# Patient Record
Sex: Male | Born: 1940 | Race: White | Hispanic: No | Marital: Married | Smoking: Never smoker
Health system: Southern US, Community
[De-identification: ages and names within clinical notes are randomized; demographics above are authoritative.]

## PROBLEM LIST (undated history)

## (undated) DIAGNOSIS — C801 Malignant (primary) neoplasm, unspecified: Secondary | ICD-10-CM

## (undated) DIAGNOSIS — N400 Enlarged prostate without lower urinary tract symptoms: Secondary | ICD-10-CM

## (undated) DIAGNOSIS — I4891 Unspecified atrial fibrillation: Secondary | ICD-10-CM

## (undated) DIAGNOSIS — H409 Unspecified glaucoma: Secondary | ICD-10-CM

## (undated) DIAGNOSIS — K219 Gastro-esophageal reflux disease without esophagitis: Secondary | ICD-10-CM

## (undated) DIAGNOSIS — M1990 Unspecified osteoarthritis, unspecified site: Secondary | ICD-10-CM

## (undated) DIAGNOSIS — N529 Male erectile dysfunction, unspecified: Secondary | ICD-10-CM

## (undated) DIAGNOSIS — N200 Calculus of kidney: Secondary | ICD-10-CM

## (undated) DIAGNOSIS — Z8719 Personal history of other diseases of the digestive system: Secondary | ICD-10-CM

## (undated) DIAGNOSIS — M545 Low back pain, unspecified: Secondary | ICD-10-CM

## (undated) DIAGNOSIS — J40 Bronchitis, not specified as acute or chronic: Secondary | ICD-10-CM

## (undated) DIAGNOSIS — R1310 Dysphagia, unspecified: Secondary | ICD-10-CM

## (undated) DIAGNOSIS — I861 Scrotal varices: Secondary | ICD-10-CM

## (undated) DIAGNOSIS — G4730 Sleep apnea, unspecified: Secondary | ICD-10-CM

## (undated) DIAGNOSIS — Z8619 Personal history of other infectious and parasitic diseases: Secondary | ICD-10-CM

## (undated) DIAGNOSIS — E785 Hyperlipidemia, unspecified: Secondary | ICD-10-CM

## (undated) DIAGNOSIS — I480 Paroxysmal atrial fibrillation: Secondary | ICD-10-CM

## (undated) DIAGNOSIS — Z87442 Personal history of urinary calculi: Secondary | ICD-10-CM

## (undated) DIAGNOSIS — I499 Cardiac arrhythmia, unspecified: Secondary | ICD-10-CM

## (undated) DIAGNOSIS — E669 Obesity, unspecified: Secondary | ICD-10-CM

## (undated) DIAGNOSIS — Z860100 Personal history of colon polyps, unspecified: Secondary | ICD-10-CM

## (undated) DIAGNOSIS — K259 Gastric ulcer, unspecified as acute or chronic, without hemorrhage or perforation: Secondary | ICD-10-CM

## (undated) DIAGNOSIS — Z9109 Other allergy status, other than to drugs and biological substances: Secondary | ICD-10-CM

## (undated) DIAGNOSIS — I10 Essential (primary) hypertension: Secondary | ICD-10-CM

## (undated) DIAGNOSIS — Z8711 Personal history of peptic ulcer disease: Secondary | ICD-10-CM

## (undated) DIAGNOSIS — E119 Type 2 diabetes mellitus without complications: Secondary | ICD-10-CM

## (undated) HISTORY — PX: NOSE SURGERY: SHX723

## (undated) HISTORY — PX: COLONOSCOPY: SHX174

## (undated) HISTORY — PX: HERNIA REPAIR: SHX51

## (undated) HISTORY — PX: CHOLECYSTECTOMY: SHX55

## (undated) HISTORY — PX: ESOPHAGOGASTRODUODENOSCOPY: SHX1529

## (undated) HISTORY — PX: OTHER SURGICAL HISTORY: SHX169

---
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
## 2006-07-25 ENCOUNTER — Ambulatory Visit: Payer: Self-pay | Admitting: Orthopaedic Surgery

## 2007-09-19 ENCOUNTER — Ambulatory Visit: Payer: Self-pay | Admitting: Unknown Physician Specialty

## 2008-04-25 ENCOUNTER — Ambulatory Visit: Payer: Self-pay | Admitting: Internal Medicine

## 2008-05-07 ENCOUNTER — Ambulatory Visit: Payer: Self-pay | Admitting: Internal Medicine

## 2008-06-25 ENCOUNTER — Ambulatory Visit: Payer: Self-pay | Admitting: Unknown Physician Specialty

## 2009-01-27 ENCOUNTER — Ambulatory Visit: Payer: Self-pay | Admitting: Unknown Physician Specialty

## 2011-10-06 DIAGNOSIS — C44320 Squamous cell carcinoma of skin of unspecified parts of face: Secondary | ICD-10-CM | POA: Insufficient documentation

## 2011-11-24 ENCOUNTER — Ambulatory Visit: Payer: Self-pay | Admitting: Internal Medicine

## 2012-07-09 DIAGNOSIS — N401 Enlarged prostate with lower urinary tract symptoms: Secondary | ICD-10-CM | POA: Insufficient documentation

## 2012-07-09 DIAGNOSIS — I861 Scrotal varices: Secondary | ICD-10-CM | POA: Insufficient documentation

## 2012-07-09 DIAGNOSIS — R350 Frequency of micturition: Secondary | ICD-10-CM | POA: Insufficient documentation

## 2012-07-18 DIAGNOSIS — N529 Male erectile dysfunction, unspecified: Secondary | ICD-10-CM | POA: Insufficient documentation

## 2012-10-23 ENCOUNTER — Ambulatory Visit: Payer: Self-pay | Admitting: Internal Medicine

## 2012-11-19 ENCOUNTER — Ambulatory Visit: Payer: Self-pay | Admitting: Unknown Physician Specialty

## 2012-11-21 LAB — PATHOLOGY REPORT

## 2012-11-23 ENCOUNTER — Observation Stay: Payer: Self-pay | Admitting: Internal Medicine

## 2012-11-23 ENCOUNTER — Other Ambulatory Visit: Payer: Self-pay | Admitting: Unknown Physician Specialty

## 2012-11-23 LAB — MAGNESIUM: Magnesium: 1.8 mg/dL

## 2012-11-23 LAB — BASIC METABOLIC PANEL
Calcium, Total: 9.3 mg/dL (ref 8.5–10.1)
Chloride: 107 mmol/L (ref 98–107)
Creatinine: 0.86 mg/dL (ref 0.60–1.30)
EGFR (African American): >60
Sodium: 138 mmol/L (ref 136–145)

## 2012-11-23 LAB — CLOSTRIDIUM DIFFICILE BY PCR

## 2012-11-24 LAB — CBC WITH DIFFERENTIAL/PLATELET
Basophil #: 0 10*3/uL (ref 0.0–0.1)
Basophil %: 0.1 %
Eosinophil #: 0.1 10*3/uL (ref 0.0–0.7)
Eosinophil %: 0.8 %
HCT: 41.5 % (ref 40.0–52.0)
HGB: 14.3 g/dL (ref 13.0–18.0)
Lymphocyte #: 0.8 10*3/uL — ABNORMAL LOW (ref 1.0–3.6)
Lymphocyte %: 10.2 %
MCH: 30.3 pg (ref 26.0–34.0)
MCHC: 34.5 g/dL (ref 32.0–36.0)
MCV: 88 fL (ref 80–100)
Monocyte #: 0.6 x10 3/mm (ref 0.2–1.0)
Monocyte %: 8 %
Neutrophil #: 6.6 10*3/uL — ABNORMAL HIGH (ref 1.4–6.5)
Neutrophil %: 80.9 %
Platelet: 204 10*3/uL (ref 150–440)
RBC: 4.73 10*6/uL (ref 4.40–5.90)
RDW: 14.1 % (ref 11.5–14.5)
WBC: 8.1 10*3/uL (ref 3.8–10.6)

## 2012-11-24 LAB — BASIC METABOLIC PANEL
Chloride: 106 mmol/L (ref 98–107)
Co2: 26 mmol/L (ref 21–32)
Creatinine: 0.88 mg/dL (ref 0.60–1.30)
EGFR (African American): >60
Glucose: 98 mg/dL (ref 65–99)
Osmolality: 278 (ref 275–301)
Potassium: 3.6 mmol/L (ref 3.5–5.1)
Sodium: 139 mmol/L (ref 136–145)

## 2012-11-25 LAB — COMPREHENSIVE METABOLIC PANEL
Albumin: 3.3 g/dL — ABNORMAL LOW (ref 3.4–5.0)
Alkaline Phosphatase: 56 U/L (ref 50–136)
Anion Gap: 8 (ref 7–16)
Bilirubin,Total: 0.6 mg/dL (ref 0.2–1.0)
Chloride: 106 mmol/L (ref 98–107)
Creatinine: 0.87 mg/dL (ref 0.60–1.30)
EGFR (Non-African Amer.): >60
Glucose: 100 mg/dL — ABNORMAL HIGH (ref 65–99)
Osmolality: 277 (ref 275–301)
Potassium: 3.5 mmol/L (ref 3.5–5.1)
Sodium: 139 mmol/L (ref 136–145)
Total Protein: 6.7 g/dL (ref 6.4–8.2)

## 2012-11-25 LAB — CBC WITH DIFFERENTIAL/PLATELET
Basophil #: 0 10*3/uL (ref 0.0–0.1)
Eosinophil #: 0.4 10*3/uL (ref 0.0–0.7)
Eosinophil %: 4.8 %
HCT: 41.4 % (ref 40.0–52.0)
HGB: 14.2 g/dL (ref 13.0–18.0)
Lymphocyte #: 1.3 10*3/uL (ref 1.0–3.6)
Lymphocyte %: 16.6 %
MCH: 29.9 pg (ref 26.0–34.0)
MCHC: 34.3 g/dL (ref 32.0–36.0)
MCV: 87 fL (ref 80–100)
Neutrophil %: 68.7 %
Platelet: 206 10*3/uL (ref 150–440)
RDW: 13.8 % (ref 11.5–14.5)
WBC: 7.7 10*3/uL (ref 3.8–10.6)

## 2012-11-25 LAB — STOOL CULTURE

## 2014-06-30 DIAGNOSIS — E119 Type 2 diabetes mellitus without complications: Secondary | ICD-10-CM | POA: Insufficient documentation

## 2014-06-30 DIAGNOSIS — Z8719 Personal history of other diseases of the digestive system: Secondary | ICD-10-CM | POA: Insufficient documentation

## 2014-06-30 DIAGNOSIS — Z9109 Other allergy status, other than to drugs and biological substances: Secondary | ICD-10-CM | POA: Insufficient documentation

## 2014-06-30 DIAGNOSIS — K219 Gastro-esophageal reflux disease without esophagitis: Secondary | ICD-10-CM | POA: Insufficient documentation

## 2014-06-30 DIAGNOSIS — H409 Unspecified glaucoma: Secondary | ICD-10-CM | POA: Insufficient documentation

## 2014-06-30 DIAGNOSIS — Z86010 Personal history of colonic polyps: Secondary | ICD-10-CM | POA: Insufficient documentation

## 2014-06-30 DIAGNOSIS — E785 Hyperlipidemia, unspecified: Secondary | ICD-10-CM | POA: Insufficient documentation

## 2014-06-30 DIAGNOSIS — M1990 Unspecified osteoarthritis, unspecified site: Secondary | ICD-10-CM | POA: Insufficient documentation

## 2014-06-30 DIAGNOSIS — Z8711 Personal history of peptic ulcer disease: Secondary | ICD-10-CM | POA: Insufficient documentation

## 2014-06-30 DIAGNOSIS — E669 Obesity, unspecified: Secondary | ICD-10-CM | POA: Insufficient documentation

## 2014-06-30 DIAGNOSIS — G4730 Sleep apnea, unspecified: Secondary | ICD-10-CM | POA: Insufficient documentation

## 2015-08-10 DIAGNOSIS — E119 Type 2 diabetes mellitus without complications: Secondary | ICD-10-CM | POA: Diagnosis not present

## 2015-08-17 DIAGNOSIS — G4733 Obstructive sleep apnea (adult) (pediatric): Secondary | ICD-10-CM | POA: Diagnosis not present

## 2015-08-17 DIAGNOSIS — E782 Mixed hyperlipidemia: Secondary | ICD-10-CM | POA: Diagnosis not present

## 2015-08-17 DIAGNOSIS — Z79899 Other long term (current) drug therapy: Secondary | ICD-10-CM | POA: Diagnosis not present

## 2015-08-17 DIAGNOSIS — E119 Type 2 diabetes mellitus without complications: Secondary | ICD-10-CM | POA: Diagnosis not present

## 2015-08-17 DIAGNOSIS — K219 Gastro-esophageal reflux disease without esophagitis: Secondary | ICD-10-CM | POA: Diagnosis not present

## 2015-11-12 DIAGNOSIS — X32XXXA Exposure to sunlight, initial encounter: Secondary | ICD-10-CM | POA: Diagnosis not present

## 2015-11-12 DIAGNOSIS — Z85828 Personal history of other malignant neoplasm of skin: Secondary | ICD-10-CM | POA: Diagnosis not present

## 2015-11-12 DIAGNOSIS — L821 Other seborrheic keratosis: Secondary | ICD-10-CM | POA: Diagnosis not present

## 2015-11-12 DIAGNOSIS — B353 Tinea pedis: Secondary | ICD-10-CM | POA: Diagnosis not present

## 2015-11-12 DIAGNOSIS — M71341 Other bursal cyst, right hand: Secondary | ICD-10-CM | POA: Diagnosis not present

## 2015-11-12 DIAGNOSIS — L570 Actinic keratosis: Secondary | ICD-10-CM | POA: Diagnosis not present

## 2015-11-13 DIAGNOSIS — E782 Mixed hyperlipidemia: Secondary | ICD-10-CM | POA: Diagnosis not present

## 2015-11-13 DIAGNOSIS — E119 Type 2 diabetes mellitus without complications: Secondary | ICD-10-CM | POA: Diagnosis not present

## 2015-11-13 DIAGNOSIS — Z79899 Other long term (current) drug therapy: Secondary | ICD-10-CM | POA: Diagnosis not present

## 2015-11-20 DIAGNOSIS — I499 Cardiac arrhythmia, unspecified: Secondary | ICD-10-CM | POA: Diagnosis not present

## 2015-11-20 DIAGNOSIS — E119 Type 2 diabetes mellitus without complications: Secondary | ICD-10-CM | POA: Diagnosis not present

## 2015-11-20 DIAGNOSIS — Z79899 Other long term (current) drug therapy: Secondary | ICD-10-CM | POA: Diagnosis not present

## 2015-11-20 DIAGNOSIS — E782 Mixed hyperlipidemia: Secondary | ICD-10-CM | POA: Diagnosis not present

## 2015-11-20 DIAGNOSIS — E6609 Other obesity due to excess calories: Secondary | ICD-10-CM | POA: Diagnosis not present

## 2015-11-20 DIAGNOSIS — Z0000 Encounter for general adult medical examination without abnormal findings: Secondary | ICD-10-CM | POA: Diagnosis not present

## 2015-11-20 DIAGNOSIS — G4733 Obstructive sleep apnea (adult) (pediatric): Secondary | ICD-10-CM | POA: Diagnosis not present

## 2015-11-25 DIAGNOSIS — I4891 Unspecified atrial fibrillation: Secondary | ICD-10-CM | POA: Diagnosis not present

## 2015-11-25 DIAGNOSIS — R0602 Shortness of breath: Secondary | ICD-10-CM | POA: Diagnosis not present

## 2015-11-25 DIAGNOSIS — R0609 Other forms of dyspnea: Secondary | ICD-10-CM | POA: Diagnosis not present

## 2015-11-25 DIAGNOSIS — R001 Bradycardia, unspecified: Secondary | ICD-10-CM | POA: Diagnosis not present

## 2015-11-25 DIAGNOSIS — K219 Gastro-esophageal reflux disease without esophagitis: Secondary | ICD-10-CM | POA: Diagnosis not present

## 2015-11-25 DIAGNOSIS — E669 Obesity, unspecified: Secondary | ICD-10-CM | POA: Diagnosis not present

## 2015-11-25 DIAGNOSIS — E119 Type 2 diabetes mellitus without complications: Secondary | ICD-10-CM | POA: Diagnosis not present

## 2015-11-25 DIAGNOSIS — M1990 Unspecified osteoarthritis, unspecified site: Secondary | ICD-10-CM | POA: Diagnosis not present

## 2015-11-25 DIAGNOSIS — E785 Hyperlipidemia, unspecified: Secondary | ICD-10-CM | POA: Diagnosis not present

## 2015-11-27 DIAGNOSIS — H40003 Preglaucoma, unspecified, bilateral: Secondary | ICD-10-CM | POA: Diagnosis not present

## 2015-12-11 DIAGNOSIS — R0602 Shortness of breath: Secondary | ICD-10-CM | POA: Diagnosis not present

## 2015-12-11 DIAGNOSIS — R0609 Other forms of dyspnea: Secondary | ICD-10-CM | POA: Diagnosis not present

## 2015-12-11 DIAGNOSIS — I4891 Unspecified atrial fibrillation: Secondary | ICD-10-CM | POA: Diagnosis not present

## 2015-12-16 DIAGNOSIS — R001 Bradycardia, unspecified: Secondary | ICD-10-CM | POA: Diagnosis not present

## 2015-12-16 DIAGNOSIS — M1990 Unspecified osteoarthritis, unspecified site: Secondary | ICD-10-CM | POA: Diagnosis not present

## 2015-12-16 DIAGNOSIS — R0602 Shortness of breath: Secondary | ICD-10-CM | POA: Diagnosis not present

## 2015-12-16 DIAGNOSIS — R0609 Other forms of dyspnea: Secondary | ICD-10-CM | POA: Diagnosis not present

## 2015-12-16 DIAGNOSIS — E785 Hyperlipidemia, unspecified: Secondary | ICD-10-CM | POA: Diagnosis not present

## 2015-12-16 DIAGNOSIS — K219 Gastro-esophageal reflux disease without esophagitis: Secondary | ICD-10-CM | POA: Diagnosis not present

## 2015-12-16 DIAGNOSIS — G4733 Obstructive sleep apnea (adult) (pediatric): Secondary | ICD-10-CM | POA: Diagnosis not present

## 2015-12-16 DIAGNOSIS — E669 Obesity, unspecified: Secondary | ICD-10-CM | POA: Diagnosis not present

## 2015-12-16 DIAGNOSIS — E119 Type 2 diabetes mellitus without complications: Secondary | ICD-10-CM | POA: Diagnosis not present

## 2015-12-16 DIAGNOSIS — I4891 Unspecified atrial fibrillation: Secondary | ICD-10-CM | POA: Diagnosis not present

## 2016-05-16 DIAGNOSIS — E119 Type 2 diabetes mellitus without complications: Secondary | ICD-10-CM | POA: Diagnosis not present

## 2016-05-16 DIAGNOSIS — E782 Mixed hyperlipidemia: Secondary | ICD-10-CM | POA: Diagnosis not present

## 2016-05-16 DIAGNOSIS — Z79899 Other long term (current) drug therapy: Secondary | ICD-10-CM | POA: Diagnosis not present

## 2016-05-17 DIAGNOSIS — E119 Type 2 diabetes mellitus without complications: Secondary | ICD-10-CM | POA: Diagnosis not present

## 2016-05-17 DIAGNOSIS — E782 Mixed hyperlipidemia: Secondary | ICD-10-CM | POA: Diagnosis not present

## 2016-05-17 DIAGNOSIS — Z79899 Other long term (current) drug therapy: Secondary | ICD-10-CM | POA: Diagnosis not present

## 2016-05-23 DIAGNOSIS — Z79899 Other long term (current) drug therapy: Secondary | ICD-10-CM | POA: Diagnosis not present

## 2016-05-23 DIAGNOSIS — Z6838 Body mass index (BMI) 38.0-38.9, adult: Secondary | ICD-10-CM | POA: Diagnosis not present

## 2016-05-23 DIAGNOSIS — E6609 Other obesity due to excess calories: Secondary | ICD-10-CM | POA: Diagnosis not present

## 2016-05-23 DIAGNOSIS — E785 Hyperlipidemia, unspecified: Secondary | ICD-10-CM | POA: Diagnosis not present

## 2016-05-23 DIAGNOSIS — I480 Paroxysmal atrial fibrillation: Secondary | ICD-10-CM | POA: Insufficient documentation

## 2016-05-23 DIAGNOSIS — E119 Type 2 diabetes mellitus without complications: Secondary | ICD-10-CM | POA: Diagnosis not present

## 2016-05-31 DIAGNOSIS — H40003 Preglaucoma, unspecified, bilateral: Secondary | ICD-10-CM | POA: Diagnosis not present

## 2016-06-20 DIAGNOSIS — R0602 Shortness of breath: Secondary | ICD-10-CM | POA: Diagnosis not present

## 2016-06-20 DIAGNOSIS — R001 Bradycardia, unspecified: Secondary | ICD-10-CM | POA: Diagnosis not present

## 2016-06-20 DIAGNOSIS — I4891 Unspecified atrial fibrillation: Secondary | ICD-10-CM | POA: Diagnosis not present

## 2016-06-20 DIAGNOSIS — E119 Type 2 diabetes mellitus without complications: Secondary | ICD-10-CM | POA: Diagnosis not present

## 2016-06-20 DIAGNOSIS — E669 Obesity, unspecified: Secondary | ICD-10-CM | POA: Diagnosis not present

## 2016-06-20 DIAGNOSIS — E785 Hyperlipidemia, unspecified: Secondary | ICD-10-CM | POA: Diagnosis not present

## 2016-06-20 DIAGNOSIS — G4733 Obstructive sleep apnea (adult) (pediatric): Secondary | ICD-10-CM | POA: Diagnosis not present

## 2016-06-20 DIAGNOSIS — K219 Gastro-esophageal reflux disease without esophagitis: Secondary | ICD-10-CM | POA: Diagnosis not present

## 2016-06-20 DIAGNOSIS — M1990 Unspecified osteoarthritis, unspecified site: Secondary | ICD-10-CM | POA: Diagnosis not present

## 2016-06-20 DIAGNOSIS — R0609 Other forms of dyspnea: Secondary | ICD-10-CM | POA: Diagnosis not present

## 2016-07-11 DIAGNOSIS — J40 Bronchitis, not specified as acute or chronic: Secondary | ICD-10-CM

## 2016-07-11 HISTORY — DX: Bronchitis, not specified as acute or chronic: J40

## 2016-07-18 DIAGNOSIS — R350 Frequency of micturition: Secondary | ICD-10-CM | POA: Diagnosis not present

## 2016-07-18 DIAGNOSIS — N401 Enlarged prostate with lower urinary tract symptoms: Secondary | ICD-10-CM | POA: Diagnosis not present

## 2016-07-18 DIAGNOSIS — N529 Male erectile dysfunction, unspecified: Secondary | ICD-10-CM | POA: Diagnosis not present

## 2016-08-12 ENCOUNTER — Other Ambulatory Visit: Payer: Self-pay | Admitting: Internal Medicine

## 2016-08-12 DIAGNOSIS — M7989 Other specified soft tissue disorders: Secondary | ICD-10-CM | POA: Diagnosis not present

## 2016-08-12 DIAGNOSIS — M25571 Pain in right ankle and joints of right foot: Secondary | ICD-10-CM | POA: Diagnosis not present

## 2016-08-12 DIAGNOSIS — M1990 Unspecified osteoarthritis, unspecified site: Secondary | ICD-10-CM | POA: Diagnosis not present

## 2016-08-12 DIAGNOSIS — M25572 Pain in left ankle and joints of left foot: Secondary | ICD-10-CM | POA: Diagnosis not present

## 2016-08-12 DIAGNOSIS — I480 Paroxysmal atrial fibrillation: Secondary | ICD-10-CM | POA: Diagnosis not present

## 2016-08-17 ENCOUNTER — Ambulatory Visit
Admission: RE | Admit: 2016-08-17 | Discharge: 2016-08-17 | Disposition: A | Discharge status: Home / Self Care | Payer: PPO | Source: Ambulatory Visit | Attending: Internal Medicine | Admitting: Internal Medicine

## 2016-08-17 DIAGNOSIS — M7989 Other specified soft tissue disorders: Secondary | ICD-10-CM | POA: Diagnosis not present

## 2016-08-17 DIAGNOSIS — R600 Localized edema: Secondary | ICD-10-CM | POA: Diagnosis not present

## 2016-11-11 DIAGNOSIS — D2272 Melanocytic nevi of left lower limb, including hip: Secondary | ICD-10-CM | POA: Diagnosis not present

## 2016-11-11 DIAGNOSIS — D225 Melanocytic nevi of trunk: Secondary | ICD-10-CM | POA: Diagnosis not present

## 2016-11-11 DIAGNOSIS — D2261 Melanocytic nevi of right upper limb, including shoulder: Secondary | ICD-10-CM | POA: Diagnosis not present

## 2016-11-11 DIAGNOSIS — Z85828 Personal history of other malignant neoplasm of skin: Secondary | ICD-10-CM | POA: Diagnosis not present

## 2016-11-14 DIAGNOSIS — Z79899 Other long term (current) drug therapy: Secondary | ICD-10-CM | POA: Diagnosis not present

## 2016-11-14 DIAGNOSIS — E785 Hyperlipidemia, unspecified: Secondary | ICD-10-CM | POA: Diagnosis not present

## 2016-11-14 DIAGNOSIS — E119 Type 2 diabetes mellitus without complications: Secondary | ICD-10-CM | POA: Diagnosis not present

## 2016-11-21 DIAGNOSIS — Z0000 Encounter for general adult medical examination without abnormal findings: Secondary | ICD-10-CM | POA: Diagnosis not present

## 2016-11-21 DIAGNOSIS — M1A00X0 Idiopathic chronic gout, unspecified site, without tophus (tophi): Secondary | ICD-10-CM | POA: Diagnosis not present

## 2016-11-21 DIAGNOSIS — E119 Type 2 diabetes mellitus without complications: Secondary | ICD-10-CM | POA: Diagnosis not present

## 2016-11-21 DIAGNOSIS — I480 Paroxysmal atrial fibrillation: Secondary | ICD-10-CM | POA: Diagnosis not present

## 2016-11-21 DIAGNOSIS — M7989 Other specified soft tissue disorders: Secondary | ICD-10-CM | POA: Diagnosis not present

## 2016-11-21 DIAGNOSIS — E785 Hyperlipidemia, unspecified: Secondary | ICD-10-CM | POA: Diagnosis not present

## 2016-11-21 DIAGNOSIS — K219 Gastro-esophageal reflux disease without esophagitis: Secondary | ICD-10-CM | POA: Diagnosis not present

## 2016-11-21 DIAGNOSIS — Z79899 Other long term (current) drug therapy: Secondary | ICD-10-CM | POA: Diagnosis not present

## 2016-11-25 DIAGNOSIS — H40003 Preglaucoma, unspecified, bilateral: Secondary | ICD-10-CM | POA: Diagnosis not present

## 2016-12-26 DIAGNOSIS — I4891 Unspecified atrial fibrillation: Secondary | ICD-10-CM | POA: Diagnosis not present

## 2016-12-26 DIAGNOSIS — R0609 Other forms of dyspnea: Secondary | ICD-10-CM | POA: Diagnosis not present

## 2016-12-26 DIAGNOSIS — E785 Hyperlipidemia, unspecified: Secondary | ICD-10-CM | POA: Diagnosis not present

## 2016-12-26 DIAGNOSIS — R0602 Shortness of breath: Secondary | ICD-10-CM | POA: Diagnosis not present

## 2016-12-26 DIAGNOSIS — G4733 Obstructive sleep apnea (adult) (pediatric): Secondary | ICD-10-CM | POA: Diagnosis not present

## 2016-12-26 DIAGNOSIS — K219 Gastro-esophageal reflux disease without esophagitis: Secondary | ICD-10-CM | POA: Diagnosis not present

## 2016-12-26 DIAGNOSIS — M1990 Unspecified osteoarthritis, unspecified site: Secondary | ICD-10-CM | POA: Diagnosis not present

## 2016-12-26 DIAGNOSIS — E669 Obesity, unspecified: Secondary | ICD-10-CM | POA: Diagnosis not present

## 2016-12-26 DIAGNOSIS — R001 Bradycardia, unspecified: Secondary | ICD-10-CM | POA: Diagnosis not present

## 2017-01-23 DIAGNOSIS — J40 Bronchitis, not specified as acute or chronic: Secondary | ICD-10-CM | POA: Diagnosis not present

## 2017-01-23 DIAGNOSIS — I480 Paroxysmal atrial fibrillation: Secondary | ICD-10-CM | POA: Diagnosis not present

## 2017-01-23 DIAGNOSIS — E119 Type 2 diabetes mellitus without complications: Secondary | ICD-10-CM | POA: Diagnosis not present

## 2017-02-23 DIAGNOSIS — L03115 Cellulitis of right lower limb: Secondary | ICD-10-CM | POA: Diagnosis not present

## 2017-02-23 DIAGNOSIS — M25571 Pain in right ankle and joints of right foot: Secondary | ICD-10-CM | POA: Diagnosis not present

## 2017-03-09 DIAGNOSIS — E119 Type 2 diabetes mellitus without complications: Secondary | ICD-10-CM | POA: Diagnosis not present

## 2017-03-09 DIAGNOSIS — Z23 Encounter for immunization: Secondary | ICD-10-CM | POA: Diagnosis not present

## 2017-03-09 DIAGNOSIS — S91001D Unspecified open wound, right ankle, subsequent encounter: Secondary | ICD-10-CM | POA: Diagnosis not present

## 2017-03-21 DIAGNOSIS — R509 Fever, unspecified: Secondary | ICD-10-CM | POA: Diagnosis not present

## 2017-03-22 ENCOUNTER — Encounter: Payer: PPO | Attending: Internal Medicine | Admitting: Internal Medicine

## 2017-03-22 DIAGNOSIS — E785 Hyperlipidemia, unspecified: Secondary | ICD-10-CM | POA: Insufficient documentation

## 2017-03-22 DIAGNOSIS — E1140 Type 2 diabetes mellitus with diabetic neuropathy, unspecified: Secondary | ICD-10-CM | POA: Diagnosis not present

## 2017-03-22 DIAGNOSIS — L97311 Non-pressure chronic ulcer of right ankle limited to breakdown of skin: Secondary | ICD-10-CM | POA: Diagnosis not present

## 2017-03-22 DIAGNOSIS — M109 Gout, unspecified: Secondary | ICD-10-CM | POA: Insufficient documentation

## 2017-03-22 DIAGNOSIS — Z7984 Long term (current) use of oral hypoglycemic drugs: Secondary | ICD-10-CM | POA: Insufficient documentation

## 2017-03-22 DIAGNOSIS — M1990 Unspecified osteoarthritis, unspecified site: Secondary | ICD-10-CM | POA: Diagnosis not present

## 2017-03-22 DIAGNOSIS — L97312 Non-pressure chronic ulcer of right ankle with fat layer exposed: Secondary | ICD-10-CM | POA: Diagnosis not present

## 2017-03-22 DIAGNOSIS — E11622 Type 2 diabetes mellitus with other skin ulcer: Secondary | ICD-10-CM | POA: Insufficient documentation

## 2017-03-22 DIAGNOSIS — I4891 Unspecified atrial fibrillation: Secondary | ICD-10-CM | POA: Insufficient documentation

## 2017-03-22 DIAGNOSIS — Z7901 Long term (current) use of anticoagulants: Secondary | ICD-10-CM | POA: Diagnosis not present

## 2017-03-23 NOTE — Progress Notes (Signed)
Corey, [name] ([id number]) Visit Report for 03/22/2017 Abuse/Suicide Risk Screen Details Patient Name: Corey [name], [name] Date of Service: 03/22/2017 8:00 AM Medical Record Number: [id number] Patient Account Number: [id number] Date of Birth/Sex: 19-May-1941 (76 y.o. Male) Treating RN: Montey Hora Primary Care Camdon Saetern: Fulton Reek Other Clinician: Referring Maiana Hennigan: Fulton Reek Treating Nolah Krenzer/Extender: Tito Dine in Treatment: 0 Abuse/Suicide Risk Screen Items Answer ABUSE/SUICIDE RISK SCREEN: Has anyone close to you tried to hurt or harm you recentlyo No Do you feel uncomfortable with anyone in your familyo No Has anyone forced you do things that you didnot want to doo No Do you have any thoughts of harming yourselfo No Patient displays signs or symptoms of abuse and/or neglect. No Electronic Signature(s) Signed: 03/22/2017 4:37:06 PM By: Montey Hora Entered By: Montey Hora on 03/22/2017 08:18:49 Maalaea, Matthews ([id number]) -------------------------------------------------------------------------------- Activities of Daily Living Details Patient Name: [name], [name] Date of Service: 03/22/2017 8:00 AM Medical Record Number: [id number] Patient Account Number: [id number] Date of Birth/Sex: 12/19/1940 (76 y.o. Male) Treating RN: Montey Hora Primary Care Fredonia Casalino: Fulton Reek Other Clinician: Referring Cordell Coke: Fulton Reek Treating Ardell Aaronson/Extender: Ricard Dillon Weeks in Treatment: 0 Activities of Daily Living Items Answer Activities of Daily Living (Please select one for each item) Drive Automobile Completely Able Take Medications Completely Able Use Telephone Completely Able Care for Appearance Completely Able Use Toilet Completely Able Bath / Shower Completely Able Dress Self Completely Able Feed Self Completely Able Walk Completely Able Get In / Out Bed Completely Able Housework Completely Able Prepare Meals Completely  [employer] for Self Completely Able Electronic Signature(s) Signed: 03/22/2017 4:37:06 PM By: Montey Hora Entered By: Montey Hora on 03/22/2017 08:19:08 Arvin, Clemens Catholic ([id number]) -------------------------------------------------------------------------------- Education Assessment Details Patient Name: [name], [name] Date of Service: 03/22/2017 8:00 AM Medical Record Number: [id number] Patient Account Number: [id number] Date of Birth/Sex: 12-27-1940 (76 y.o. Male) Treating RN: Montey Hora Primary Care Kandon Hosking: Fulton Reek Other Clinician: Referring Macaria Bias: Fulton Reek Treating Jihan Rudy/Extender: Tito Dine in Treatment: 0 Primary Learner Assessed: Patient Learning Preferences/Education Level/Primary Language Learning Preference: Explanation, Demonstration Highest Education Level: High School Preferred Language: English Cognitive Barrier Assessment/Beliefs Language Barrier: No Translator Needed: No Memory Deficit: No Emotional Barrier: No Cultural/Religious Beliefs Affecting Medical No Care: Physical Barrier Assessment Impaired Vision: No Impaired Hearing: No Decreased Hand dexterity: No Knowledge/Comprehension Assessment Knowledge Level: Medium Comprehension Level: Medium Ability to understand written Medium instructions: Ability to understand verbal Medium instructions: Motivation Assessment Anxiety Level: Calm Cooperation: Cooperative Education Importance: Acknowledges Need Interest in Health Problems: Asks Questions Perception: Coherent Willingness to Engage in Self- Medium Management Activities: Readiness to Engage in Self- Medium Management Activities: Electronic Signature(s) ALEXANDROS, EWAN ([id number]) Signed: 03/22/2017 4:37:06 PM By: Montey Hora Entered By: Montey Hora on 03/22/2017 08:19:32 Isaacks, Geordan Carlean Jews  ([id number]) -------------------------------------------------------------------------------- Fall Risk Assessment Details Patient Name: [name], [name] Date of Service: 03/22/2017 8:00 AM Medical Record Number: [id number] Patient Account Number: [id number] Date of Birth/Sex: Feb 17, 1941 (76 y.o. Male) Treating RN: Montey Hora Primary Care Maleeha Halls: Fulton Reek Other Clinician: Referring Aniyla Harling: Fulton Reek Treating Meeya Goldin/Extender: Tito Dine in Treatment: 0 Fall Risk Assessment Items Have you had 2 or more falls in the last 12 monthso 0 No Have you had any fall that resulted in injury in the last 12 monthso 0 No FALL RISK ASSESSMENT: History of falling - immediate or within 3 months 0 No Secondary diagnosis 0 No Ambulatory aid None/bed rest/wheelchair/nurse 0 Yes Crutches/cane/walker  0 No Furniture 0 No IV Access/Saline Lock 0 No Gait/Training Normal/bed rest/immobile 0 Yes Weak 0 No Impaired 0 No Mental Status Oriented to own ability 0 Yes Electronic Signature(s) Signed: 03/22/2017 4:37:06 PM By: Montey Hora Entered By: Montey Hora on 03/22/2017 08:19:43 Basford, Jaymason L. ([id number]) -------------------------------------------------------------------------------- Foot Assessment Details Patient Name: [name], [name] Date of Service: 03/22/2017 8:00 AM Medical Record Number: [id number] Patient Account Number: [id number] Date of Birth/Sex: 1941/03/28 (76 y.o. Male) Treating RN: Montey Hora Primary Care Joren Rehm: Fulton Reek Other Clinician: Referring Antasia Haider: Fulton Reek Treating Jahden Schara/Extender: Tito Dine in Treatment: 0 Foot Assessment Items Site Locations + = Sensation present, - = Sensation absent, C = Callus, U = Ulcer R = Redness, W = Warmth, M = Maceration, PU = Pre-ulcerative lesion F = Fissure, S = Swelling, D = Dryness Assessment Right: Left: Other Deformity: No No Prior Foot Ulcer: No No Prior  Amputation: No No Charcot Joint: No No Ambulatory Status: Ambulatory Without Help Gait: Steady Electronic Signature(s) Signed: 03/22/2017 4:37:06 PM By: Montey Hora Entered By: Montey Hora on 03/22/2017 08:32:00 Devera, Kayon L. ([id number]) -------------------------------------------------------------------------------- Nutrition Risk Assessment Details Patient Name: [name], [name] Date of Service: 03/22/2017 8:00 AM Medical Record Number: [id number] Patient Account Number: [id number] Date of Birth/Sex: [birth year] (76 y.o. Male) Treating RN: Montey Hora Primary Care Jniyah Dantuono: Fulton Reek Other Clinician: Referring Dejon Jungman: Fulton Reek Treating Westyn Driggers/Extender: Ricard Dillon Weeks in Treatment: 0 Height (in): Weight (lbs): Body Mass Index (BMI): Nutrition Risk Assessment Items NUTRITION RISK SCREEN: I have an illness or condition that made me change the kind and/or 0 No amount of food I eat I eat fewer than two meals per day 0 No I eat few fruits and vegetables, or milk products 0 No I have three or more drinks of beer, liquor or wine almost every day 0 No I have tooth or mouth problems that make it hard for me to eat 0 No I don't always have enough money to buy the food I need 0 No I eat alone most of the time 0 No I take three or more different prescribed or over-the-counter drugs a 1 Yes day Without wanting to, I have lost or gained 10 pounds in the last six 0 No months I am not always physically able to shop, cook and/or feed myself 0 No Nutrition Protocols Good Risk Protocol 0 No interventions needed Moderate Risk Protocol Electronic Signature(s) Signed: 03/22/2017 4:37:06 PM By: Montey Hora Entered By: Montey Hora on 03/22/2017 08:19:50

## 2017-03-23 NOTE — Progress Notes (Signed)
ROYCE, STEGMAN ([id number]) Visit Report for 03/22/2017 Debridement Details Patient Name: Kiser, Shayaan L. Date of Service: 03/22/2017 8:00 AM Medical Record Number: [id number] Patient Account Number: [id number] Date of Birth/Sex: [birth year] (76 y.o. Male) Treating RN: Montey Hora Primary Care Provider: Fulton Reek Other Clinician: Referring Provider: Fulton Reek Treating Provider/Extender: Tito Dine in Treatment: 0 Debridement Performed for Wound #1 Right Achilles Assessment: Performed By: Physician Ricard Dillon, MD Debridement: Debridement Severity of Tissue Pre Fat layer exposed Debridement: Pre-procedure Verification/Time Out Yes - 08:49 Taken: Start Time: 08:49 Pain Control: Lidocaine 4% Topical Solution Level: Skin/Subcutaneous Tissue Total Area Debrided (L x 0.9 (cm) x 0.8 (cm) = 0.72 (cm) W): Tissue and other Viable, Non-Viable, Eschar, Fibrin/Slough, Subcutaneous material debrided: Instrument: Curette Bleeding: Minimum Hemostasis Achieved: Pressure End Time: 08:52 Procedural Pain: 0 Post Procedural Pain: 0 Response to Treatment: Procedure was tolerated well Post Debridement Measurements of Total Wound Length: (cm) 1 Width: (cm) 0.9 Depth: (cm) 0.1 Volume: (cm) 0.071 Character of Wound/Ulcer Post Improved Debridement: Severity of Tissue Post Debridement: Fat layer exposed Post Procedure Diagnosis Same as Pre-procedure LAMORRIS, KNOBLOCK ([id number]) Electronic Signature(s) Signed: 03/22/2017 4:34:43 PM By: Linton Ham MD Signed: 03/22/2017 4:37:06 PM By: Montey Hora Entered By: Linton Ham on 03/22/2017 08:57:53 [place], [place] ([id number]) -------------------------------------------------------------------------------- HPI Details Patient Name: Vespa, Dub L. Date of Service: 03/22/2017 8:00 AM Medical Record Number: [id number] Patient Account Number: [id number] Date of Birth/Sex: June 25, 1941 (76 y.o. Male) Treating RN:  Montey Hora Primary Care Provider: Fulton Reek Other Clinician: Referring Provider: Fulton Reek Treating Provider/Extender: Tito Dine in Treatment: 0 History of Present Illness HPI Description: 03/22/17; patient is a 76 year old type II diabetic not on insulin. He has no prior wound history. Roughly 5-6 weeks ago he was out working outside had a dog on a chain which eventually wrapped around his ankle causing a fairly significant laceration over the mid part of his right Achilles. The patient tried to treat this himself with topical peroxide however he was ultimately seen in his primary physician's office on 02/23/17 felt to have a wound infection. Treated with Bactroban and 10 days of Ceftin. He was seen again in follow-up on 03/09/17 still felt to be infected and put on Septra DS twice a day for 10 days which she is completed. This hasn't resulted in marked improvement of the erythema which at one point was spreading into his anterior foot. The patient does not have a known history of PAD or claudication. No wound care history. He is a nonsmoker. 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 fib on Xarelto, type 2 diabetes on oral agents, gout including involvement of the right ankle. I note a call yesterday from his wife to his primary doctor's office in [place] with fever and chills. He was seen his primary doctor's office and given a macrolide. He feels better today. He is still applying peroxide to the wound surface. ABI in this clinic 1.16 on the right and 1.23 on the left Electronic Signature(s) Signed: 03/22/2017 4:34:43 PM By: Linton Ham MD Entered By: Linton Ham on 03/22/2017 09:05:22 [place], [place] ([id number]) -------------------------------------------------------------------------------- Physical Exam Details Patient Name: Falk, Chayton L. Date of Service: 03/22/2017 8:00 AM Medical Record Number: [id number] Patient  Account Number: [id number] Date of Birth/Sex: 06-Jul-1941 (76 y.o. Male) Treating RN: Montey Hora Primary Care Provider: Fulton Reek Other Clinician: Referring Provider: Fulton Reek Treating Provider/Extender: Ricard Dillon Weeks in Treatment: 0 Constitutional  Sitting or standing Blood Pressure is within target range for patient.. Pulse regular and within target range for patient.Marland Kitchen Respirations regular, non-labored and within target range.. Temperature is normal and within the target range for the patient.Marland Kitchen appears in no distress. Eyes Conjunctivae clear. No discharge. Respiratory Respiratory effort is easy and symmetric bilaterally. Rate is normal at rest and on room air.. Bilateral breath sounds are clear and equal in all lobes with no wheezes, rales or rhonchi.. Cardiovascular Femoral arteries without bruits and pulses strong.. Pedal pulses palpable and strong on the right. Some mild changes of chronic venous insufficiency with hemosiderin deposition but no significant edema. Lymphatic Nonpalpable no popliteal or inguinal area. Neurological Sensation in the right foot is intact to the microfilament but reduced vibration sense. Psychiatric No evidence of depression, anxiety, or agitation. Calm, cooperative, and communicative. Appropriate interactions and affect.. Notes When exam; small wound covered and the surface eschar in the mid part of his posterior Achilles. There is no surrounding erythema. The patient describes some discharge although I didn't see any. He also has a history of gout in his right ankle is fairly normal. Using a #3 curet the eschar and some nonviable surface tissue was removed. Underneath this appears to be a healthy wound that should progress towards closure. Ears no evidence of surrounding infection or gout Electronic Signature(s) Signed: 03/22/2017 4:34:43 PM By: Linton Ham MD Entered By: Linton Ham on 03/22/2017 09:04:33 [place], [place]  Carlean Jews ([id number]) -------------------------------------------------------------------------------- Physician Orders Details Patient Name: Fusilier, Shreyas L. Date of Service: 03/22/2017 8:00 AM Medical Record Number: [id number] Patient Account Number: [id number] Date of Birth/Sex: 1940/12/16 (76 y.o. 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 o May Shower, gently pat wound dry prior to applying new dressing. Anesthetic Wound #1 Right Achilles o Topical Lidocaine 4% cream applied to wound bed prior to debridement Primary Wound Dressing Wound #1 Right Achilles o Prisma Ag Secondary Dressing Wound #1 Right Achilles o Dry Gauze o Boardered Foam Dressing - or telfa island dressing Dressing Change Frequency Wound #1 Right Achilles o Change dressing every other day. Follow-up Appointments Wound #1 Right Achilles o Return Appointment in 1 week. Edema Control Wound #1 Right Achilles o Elevate legs to the level of the heart and pump ankles as often as possible Jacot, Jacek L. ([id number]) Additional Orders / Instructions Wound #1 Right Achilles o Increase protein intake. Electronic Signature(s) Signed: 03/22/2017 4:34:43 PM By: Linton Ham MD Signed: 03/22/2017 4:37:06 PM By: Montey Hora Entered By: Montey Hora on 03/22/2017 08:56:22 [place], [place] ([id number]) -------------------------------------------------------------------------------- Problem List Details Patient Name: Speegle, Justn L. Date of Service: 03/22/2017 8:00 AM Medical  Record Number: [id number] Patient Account Number: [id number] Date of Birth/Sex: 1940/09/11 (76 y.o. Male) Treating RN: Montey Hora Primary Care Provider: Fulton Reek Other Clinician: Referring Provider: Fulton Reek Treating Provider/Extender: Tito Dine in Treatment: 0 Active Problems ICD-10 Encounter Code Description Active Date Diagnosis L97.311 Non-pressure chronic ulcer of right ankle limited to 03/22/2017 Yes breakdown of skin E11.622 Type 2 diabetes mellitus with other skin ulcer 03/22/2017 Yes Inactive Problems Resolved Problems Electronic Signature(s) Signed: 03/22/2017 4:34:43 PM By: Linton Ham MD Entered  By: Linton Ham on 03/22/2017 08:43:33 [place], Thoma L. ([id number]) -------------------------------------------------------------------------------- Progress Note Details Patient Name: Eskenazi, Korver L. Date of Service: 03/22/2017 8:00 AM Medical Record Number: [id number] Patient Account Number: [id number] Date of Birth/Sex: 1940-10-21 (76 y.o. Male) Treating RN: Montey Hora Primary Care Provider: Fulton Reek Other Clinician: Referring Provider: Fulton Reek Treating Provider/Extender: Ricard Dillon Weeks in Treatment: 0 Subjective History of Present Illness (HPI) 03/22/17; patient is a 76 year old type II diabetic not on insulin. He has no prior wound history. Roughly 5-6 weeks ago he was out working outside had a dog on a chain which eventually wrapped around his ankle causing a fairly significant laceration over the mid part of his right Achilles. The patient tried to treat this himself with topical peroxide however he was ultimately seen in his primary physician's office on 02/23/17 felt to have a wound infection. Treated with Bactroban and 10 days of Ceftin. He was seen again in follow-up on 03/09/17 still felt to be infected and put on Septra DS twice a day for 10 days which she is completed. This hasn't resulted in marked  improvement of the erythema which at one point was spreading into his anterior foot. The patient does not have a known history of PAD or claudication. No wound care history. He is a nonsmoker. 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 fib on Xarelto, type 2 diabetes on oral agents, gout including involvement of the right ankle. I note a call yesterday from his wife to his primary doctor's office in [place] with fever and chills. He was seen his primary doctor's office and given a macrolide. He feels better today. He is still applying peroxide to the wound surface. ABI in this clinic 1.16 on the right and 1.23 on the left Wound History Patient presents with 1 open wound that has been present for approximately 1 month. Patient has been treating wound in the following manner: mupirocin. Laboratory tests have not been performed in the last month. Patient reportedly has not tested positive for an antibiotic resistant organism. Patient reportedly has not tested positive for osteomyelitis. Patient reportedly has not had testing performed to evaluate circulation in the legs. Patient experiences the following problems associated with their wounds: infection. Patient History Information obtained from Patient. Allergies No Known Allergies Family History Cancer - Mother, Father, No family history of Diabetes, Heart Disease, Hereditary Spherocytosis, Hypertension, Kidney Disease, Lung Disease, Seizures, Stroke, Thyroid Problems, Tuberculosis. Social History CONCEPCION, KIRKPATRICK ([id number]) Never smoker, Marital Status - Married, Alcohol Use - Rarely, Drug Use - No History, Caffeine Use - Daily. Medical History Respiratory Patient has history of Sleep Apnea Cardiovascular Patient has history of Arrhythmia - a fib Endocrine Patient has history of Type II Diabetes Musculoskeletal Patient has history of Osteoarthritis Neurologic Patient has history of Neuropathy Patient is  treated with Oral Agents. Blood sugar is tested. Medical And Surgical History Notes Respiratory acute bronchitis Cardiovascular hyperlipidemia Review of Systems (ROS) Constitutional Symptoms (General Health) The patient has no complaints or symptoms. Eyes The patient has no complaints or symptoms. Ear/Nose/Mouth/Throat The patient has no complaints or symptoms. Hematologic/Lymphatic The patient has no complaints or symptoms. Respiratory Complains or has symptoms of Shortness of Breath. Cardiovascular The patient has no complaints or symptoms. Gastrointestinal The patient has no complaints or symptoms. Genitourinary The patient has no complaints or symptoms. Immunological The patient has no complaints or symptoms. Integumentary (Skin) The patient has no complaints or symptoms. Musculoskeletal The  patient has no complaints or symptoms. Neurologic The patient has no complaints or symptoms. Oncologic The patient has no complaints or symptoms. Psychiatric The patient has no complaints or symptoms. Schermer, Marjorie L. ([id number]) Objective Constitutional Sitting or standing Blood Pressure is within target range for patient.. Pulse regular and within target range for patient.Marland Kitchen Respirations regular, non-labored and within target range.. Temperature is normal and within the target range for the patient.Marland Kitchen appears in no distress. Vitals Time Taken: 8:20 AM, Height: 66 in, Source: Measured, Weight: 252 lbs, Source: Measured, BMI: 40.7, Temperature: 98.4 F, Pulse: 92 bpm, Respiratory Rate: 18 breaths/min, Blood Pressure: 100/55 mmHg. Eyes Conjunctivae clear. No discharge. Respiratory Respiratory effort is easy and symmetric bilaterally. Rate is normal at rest and on room air.. Bilateral breath sounds are clear and equal in all lobes with no wheezes, rales or rhonchi.. Cardiovascular Femoral arteries without bruits and pulses strong.. Pedal pulses palpable and strong on the right.  Some mild changes of chronic venous insufficiency with hemosiderin deposition but no significant edema. Lymphatic Nonpalpable no popliteal or inguinal area. Neurological Sensation in the right foot is intact to the microfilament but reduced vibration sense. Psychiatric No evidence of depression, anxiety, or agitation. Calm, cooperative, and communicative. Appropriate interactions and affect.. General Notes: When exam; small wound covered and the surface eschar in the mid part of his posterior Achilles. There is no surrounding erythema. The patient describes some discharge although I didn't see any. He also has a history of gout in his right ankle is fairly normal. Using a #3 curet the eschar and some nonviable surface tissue was removed. Underneath this appears to be a healthy wound that should progress towards closure. Ears no evidence of surrounding infection or gout Integumentary (Hair, Skin) Hamler, Keric L. ([id number]) Wound #1 status is Open. Original cause of wound was Trauma. The wound is located on the Right Achilles. The wound measures 0.9cm length x 0.8cm width x 0.1cm depth; 0.565cm^2 area and 0.057cm^3 volume. There is no tunneling or undermining noted. There is a medium amount of serous drainage noted. The wound margin is flat and intact. There is no granulation within the wound bed. There is a large (67- 100%) amount of necrotic tissue within the wound bed including Eschar. The periwound skin appearance exhibited: Scarring, Erythema. The periwound skin appearance did not exhibit: Callus, Crepitus, Excoriation, Induration, Rash, Dry/Scaly, Maceration, Atrophie Blanche, Cyanosis, Ecchymosis, Hemosiderin Staining, Mottled, Pallor, Rubor. The surrounding wound skin color is noted with erythema which is circumferential. Periwound temperature was noted as No Abnormality. The periwound has tenderness on palpation. Assessment Active Problems ICD-10 G40.102 - Non-pressure chronic  ulcer of right ankle limited to breakdown of skin E11.622 - Type 2 diabetes mellitus with other skin ulcer Procedures Wound #1 Pre-procedure diagnosis of Wound #1 is a Diabetic Wound/Ulcer of the Lower Extremity located on the Right Achilles .Severity of Tissue Pre Debridement is: Fat layer exposed. There was a Skin/Subcutaneous Tissue Debridement ([id number]) debridement with total area of 0.72 sq cm performed by Ricard Dillon, MD. with the following instrument(s): Curette to remove Viable and Non-Viable tissue/material including Fibrin/Slough, Eschar, and Subcutaneous after achieving pain control using Lidocaine 4% Topical Solution. A time out was conducted at 08:49, prior to the start of the procedure. A Minimum amount of bleeding was controlled with Pressure. The procedure was tolerated well with a pain level of 0 throughout and a pain level of 0 following the procedure. Post Debridement Measurements: 1cm length x 0.9cm width x 0.1cm depth;  0.071cm^3 volume. Character of Wound/Ulcer Post Debridement is improved. Severity of Tissue Post Debridement is: Fat layer exposed. Post procedure Diagnosis Wound #1: Same as Pre-Procedure Plan Whitwell, Dontae L. ([id number]) Wound Cleansing: Wound #1 Right Achilles: Clean wound with Normal Saline. May Shower, gently pat wound dry prior to applying new dressing. Anesthetic: Wound #1 Right Achilles: Topical Lidocaine 4% cream applied to wound bed prior to debridement Primary Wound Dressing: Wound #1 Right Achilles: Prisma Ag Secondary Dressing: Wound #1 Right Achilles: Dry Gauze Boardered Foam Dressing - or telfa island dressing Dressing Change Frequency: Wound #1 Right Achilles: Change dressing every other day. Follow-up Appointments: Wound #1 Right Achilles: Return Appointment in 1 week. Edema Control: Wound #1 Right Achilles: Elevate legs to the level of the heart and pump ankles as often as possible Additional Orders /  Instructions: Wound #1 Right Achilles: Increase protein intake. #1 traumatic wound on the right Achilles area. This is already progressed nicely towards healing. Post debridement we use silver collagen and border foam. I expect this to be healed in a week or 2 #2 although the patient's history is quite convincing for infection, also review his primary doctor's note suggests the same there is no evidence of this currently no culture needed to be done. The patient describes purulence coming out of this however even after debridement there is no evidence of this #3 the patient has some elements of chronic venous insufficiency but this is mild no edema #4 probable diabetic neuropathy. His recent hemoglobin A1c however 6.4 Electronic Signature(s) Signed: 03/22/2017 4:34:43 PM By: Linton Ham MD Entered By: Linton Ham on 03/22/2017 09:06:36 [place], Clemens Catholic ([id number]) [place], [place]. ([id number]) -------------------------------------------------------------------------------- ROS/PFSH Details Patient Name: [name], Koah L. Date of Service: 03/22/2017 8:00 AM Medical Record Number: [id number] Patient Account Number: [id number] Date of Birth/Sex: [birth year] (76 y.o. Male) Treating RN: Montey Hora Primary Care Provider: Fulton Reek Other Clinician: Referring Provider: Fulton Reek Treating Provider/Extender: Tito Dine in Treatment: 0 Information Obtained From Patient Wound History Do you currently have one or more open woundso Yes How many open wounds do you currently haveo 1 Approximately how long have you had your woundso 1 month How have you been treating your wound(s) until nowo mupirocin Has your wound(s) ever healed and then re-openedo No Have you had any lab work done in the past montho No Have you tested positive for an antibiotic resistant organism (MRSA, VRE)o No Have you tested positive for osteomyelitis (bone infection)o No Have you had any tests for  circulation on your legso No Have you had other problems associated with your woundso Infection Respiratory Complaints and Symptoms: Positive for: Shortness of Breath Medical History: Positive for: Sleep Apnea Past Medical History Notes: acute bronchitis Constitutional Symptoms (General Health) Complaints and Symptoms: No Complaints or Symptoms Eyes Complaints and Symptoms: No Complaints or Symptoms Ear/Nose/Mouth/Throat Complaints and Symptoms: No Complaints or Symptoms Hematologic/Lymphatic Ferraz, Kerman L. ([id number]) Complaints and Symptoms: No Complaints or Symptoms Cardiovascular Complaints and Symptoms: No Complaints or Symptoms Medical History: Positive for: Arrhythmia - a fib Past Medical History Notes: hyperlipidemia Gastrointestinal Complaints and Symptoms: No Complaints or Symptoms Endocrine Medical History: Positive for: Type II Diabetes Treated with: Oral agents Blood sugar tested every day: Yes Tested : QD Genitourinary Complaints and Symptoms: No Complaints or Symptoms Immunological Complaints and Symptoms: No Complaints or Symptoms Integumentary (Skin) Complaints and Symptoms: No Complaints or Symptoms Musculoskeletal Complaints and Symptoms: No Complaints or Symptoms Medical History: Positive for: Osteoarthritis Neurologic Complaints and Symptoms:  No Complaints or Symptoms Schow, Leiby L. ([id number]) Medical History: Positive for: Neuropathy Oncologic Complaints and Symptoms: No Complaints or Symptoms Psychiatric Complaints and Symptoms: No Complaints or Symptoms Immunizations Pneumococcal Vaccine: Received Pneumococcal Vaccination: Yes Tetanus Vaccine: Last tetanus shot: 02/21/2017 Family and Social History Cancer: Yes - Mother, Father; Diabetes: No; Heart Disease: No; Hereditary Spherocytosis: No; Hypertension: No; Kidney Disease: No; Lung Disease: No; Seizures: No; Stroke: No; Thyroid Problems: No; Tuberculosis: No; Never  smoker; Marital Status - Married; Alcohol Use: Rarely; Drug Use: No History; Caffeine Use: Daily; Financial Concerns: No; Food, Clothing or Shelter Needs: No; Support System Lacking: No; Transportation Concerns: No; Advanced Directives: No; Patient does not want information on Advanced Directives Electronic Signature(s) Signed: 03/22/2017 4:34:43 PM By: Linton Ham MD Signed: 03/22/2017 4:37:06 PM By: Montey Hora Entered By: Montey Hora on 03/22/2017 08:29:29 [place], Loukas L. ([id number]) -------------------------------------------------------------------------------- SuperBill Details Patient Name: Goertzen, Abdulhadi L. Date of Service: 03/22/2017 Medical Record Number: [id number] Patient Account Number: [id number] Date of Birth/Sex: 07-27-1940 (76 y.o. Male) Treating RN: Montey Hora Primary Care Provider: Fulton Reek Other Clinician: Referring Provider: Fulton Reek Treating Provider/Extender: Ricard Dillon Weeks in Treatment: 0 Diagnosis Coding ICD-10 Codes Code Description B76.283 Non-pressure chronic ulcer of right ankle limited to breakdown of skin E11.622 Type 2 diabetes mellitus with other skin ulcer Facility Procedures CPT4 Code Description: 15176160 99213 - WOUND CARE VISIT-LEV 3 EST PT Modifier: Quantity: 1 CPT4 Code Description: [id number] 11042 - DEB SUBQ TISSUE 20 SQ CM/< ICD-10 Description Diagnosis L97.311 Non-pressure chronic ulcer of right ankle limited t Modifier: o breakdown of Quantity: 1 skin Physician Procedures CPT4 Code Description: [id number] WC PHYS LEVEL 3 o NEW PT ICD-10 Description Diagnosis L97.311 Non-pressure chronic ulcer of right ankle limited t E11.622 Type 2 diabetes mellitus with other skin ulcer Modifier: 25 o breakdown of Quantity: 1 skin CPT4 Code Description: 7035009 11042 - WC PHYS SUBQ TISS 20 SQ CM ICD-10 Description Diagnosis L97.311 Non-pressure chronic ulcer of right ankle limited t Modifier: o breakdown of Quantity: 1  skin Electronic Signature(s) Unsigned Previous Signature: 03/22/2017 4:34:43 PM Version By: Linton Ham MD Entered By: Montey Hora on 03/23/2017 07:57:35 Signature(s): Date(s):

## 2017-03-24 NOTE — Progress Notes (Signed)
JADIE, COMAS ([id number]) Visit Report for 03/22/2017 Allergy List Details Patient Name: Corey [name], [name] Date of Service: 03/22/2017 8:00 AM Medical Record Number: [id number] Patient Account Number: [id number] Date of Birth/Sex: [birth year] (76 y.o. Male) Treating RN: Montey Hora Primary Care Hyatt Capobianco: Fulton Reek Other Clinician: Referring Yamile Roedl: Fulton Reek Treating Caedan Sumler/Extender: Ricard Dillon Weeks in Treatment: 0 Allergies Active Allergies No Known Allergies Allergy Notes Electronic Signature(s) Signed: 03/22/2017 4:37:06 PM By: Montey Hora Entered By: Montey Hora on 03/22/2017 08:18:40 Youngers, Tayon L. ([id number]) -------------------------------------------------------------------------------- Arrival Information Details Patient Name: Corey [name], [name] Date of Service: 03/22/2017 8:00 AM Medical Record Number: [id number] Patient Account Number: [id number] Date of Birth/Sex: Jan 23, 1941 (76 y.o. Male) Treating RN: Montey Hora Primary Care Jarian Longoria: Fulton Reek Other Clinician: Referring Shemica Meath: Fulton Reek Treating Tikia Skilton/Extender: Tito Dine in Treatment: 0 Visit Information Patient Arrived: Ambulatory Arrival Time: 08:16 Accompanied By: self Transfer Assistance: None Patient Identification Verified: Yes Secondary Verification Process Yes Completed: Patient Has Alerts: Yes Patient Alerts: Patient on Blood Thinner xarelto Electronic Signature(s) Signed: 03/22/2017 4:37:06 PM By: Montey Hora Entered By: Montey Hora on 03/22/2017 08:18:19 [place]green, Bridger ([id number]) -------------------------------------------------------------------------------- Clinic Level of Care Assessment Details Patient Name: Corey [name], [name] Date of Service: 03/22/2017 8:00 AM Medical Record Number: [id number] Patient Account Number: [id number] Date of Birth/Sex: July 31, 1940 (76 y.o. Male) Treating RN: Montey Hora Primary Care Brandee Markin:  Fulton Reek Other Clinician: Referring Jafeth Mustin: Fulton Reek Treating Sehaj Mcenroe/Extender: Tito Dine in Treatment: 0 Clinic Level of Care Assessment Items TOOL 1 Quantity Score []  - Use when EandM and Procedure is performed on INITIAL visit 0 ASSESSMENTS - Nursing Assessment / Reassessment X - General Physical Exam (combine w/ comprehensive assessment (listed just 1 20 below) when performed on new pt. evals) X - Comprehensive Assessment (HX, ROS, Risk Assessments, Wounds Hx, etc.) 1 25 ASSESSMENTS - Wound and Skin Assessment / Reassessment []  - Dermatologic / Skin Assessment (not related to wound area) 0 ASSESSMENTS - Ostomy and/or Continence Assessment and Care []  - Incontinence Assessment and Management 0 []  - Ostomy Care Assessment and Management (repouching, etc.) 0 PROCESS - Coordination of Care X - Simple Patient / Family Education for ongoing care 1 15 []  - Complex (extensive) Patient / Family Education for ongoing care 0 X - Staff obtains Programmer, systems, Records, Test Results / Process Orders 1 10 []  - Staff telephones HHA, Nursing Homes / Clarify orders / etc 0 []  - Routine Transfer to another Facility (non-emergent condition) 0 []  - Routine Hospital Admission (non-emergent condition) 0 X - New Admissions / Biomedical engineer / Ordering NPWT, Apligraf, etc. 1 15 []  - Emergency Hospital Admission (emergent condition) 0 PROCESS - Special Needs []  - Pediatric / Minor Patient Management 0 []  - Isolation Patient Management 0 Bucholz, Travas L. ([id number]) []  - Hearing / Language / Visual special needs 0 []  - Assessment of Community assistance (transportation, D/C planning, etc.) 0 []  - Additional assistance / Altered mentation 0 []  - Support Surface(s) Assessment (bed, cushion, seat, etc.) 0 INTERVENTIONS - Miscellaneous []  - External ear exam 0 []  - Patient Transfer (multiple staff / Civil Service fast streamer / Similar devices) 0 []  - Simple Staple / Suture removal (25 or  less) 0 []  - Complex Staple / Suture removal (26 or more) 0 []  - Hypo/Hyperglycemic Management (do not check if billed separately) 0 X - Ankle / Brachial Index (ABI) - do not check if billed separately 1 15 Has the patient been seen at the hospital within the  last three years: Yes Total Score: 100 Level Of Care: New/Established - Level 3 Electronic Signature(s) Signed: 03/22/2017 4:37:06 PM By: Montey Hora Entered By: Montey Hora on 03/22/2017 11:25:11 Krock, Terez L. ([id number]) -------------------------------------------------------------------------------- Encounter Discharge Information Details Patient Name: Corey [name], [name] Date of Service: 03/22/2017 8:00 AM Medical Record Number: [id number] Patient Account Number: [id number] Date of Birth/Sex: 08/12/1940 (76 y.o. Male) Treating RN: Montey Hora Primary Care Abimael Zeiter: Fulton Reek Other Clinician: Referring Deziyah Arvin: Fulton Reek Treating Rainn Zupko/Extender: Tito Dine in Treatment: 0 Encounter Discharge Information Items Discharge Pain Level: 0 Discharge Condition: Stable Ambulatory Status: Ambulatory Discharge Destination: Home Transportation: Private Auto Accompanied By: self Schedule Follow-up Appointment: Yes Medication Reconciliation completed and provided to Patient/Care No Lonnetta Kniskern: Provided on Clinical Summary of Care: 03/22/2017 Form Type Recipient Paper Patient HP Electronic Signature(s) Signed: 03/23/2017 9:46:21 AM By: Ruthine Dose Entered By: Ruthine Dose on 03/22/2017 09:05:10 Corey [name], [name] ([id number]) -------------------------------------------------------------------------------- Lower Extremity Assessment Details Patient Name: Corey [name], [name] Date of Service: 03/22/2017 8:00 AM Medical Record Number: [id number] Patient Account Number: [id number] Date of Birth/Sex: [birth year] (76 y.o. Male) Treating RN: Montey Hora Primary Care Vonda Harth: Fulton Reek Other  Clinician: Referring Diago Haik: Fulton Reek Treating Sy Saintjean/Extender: Tito Dine in Treatment: 0 Vascular Assessment Pulses: Dorsalis Pedis Palpable: [Left:Yes] [Right:Yes] Doppler Audible: [Left:Yes] [Right:Yes] Posterior Tibial Palpable: [Left:Yes] [Right:Yes] Doppler Audible: [Left:Yes] [Right:Yes] Extremity colors, hair growth, and conditions: Extremity Color: [Left:Hyperpigmented] [Right:Hyperpigmented] Hair Growth on Extremity: [Left:Yes] [Right:Yes] Temperature of Extremity: [Left:Warm] [Right:Warm] Capillary Refill: [Left:< 3 seconds] [Right:< 3 seconds] Blood Pressure: Brachial: [Left:128] [Right:122] Dorsalis Pedis: 158 [Left:Dorsalis Pedis: 148] Ankle: Posterior Tibial: 144 [Left:Posterior Tibial: 1.23] [Right:1.16] Toe Nail Assessment Left: Right: Thick: Yes Yes Discolored: Yes Yes Deformed: No No Improper Length and Hygiene: No No Electronic Signature(s) Signed: 03/22/2017 4:37:06 PM By: Montey Hora Entered By: Montey Hora on 03/22/2017 08:43:27 Corey [name], [name] ([id number]) -------------------------------------------------------------------------------- Multi Wound Chart Details Patient Name: Corey [name], [name] Date of Service: 03/22/2017 8:00 AM Medical Record Number: [id number] Patient Account Number: [id number] Date of Birth/Sex: 1941-04-25 (76 y.o. Male) Treating RN: Montey Hora Primary Care Macrae Wiegman: Fulton Reek Other Clinician: Referring Shraddha Lebron: Fulton Reek Treating Camira Geidel/Extender: Ricard Dillon Weeks in Treatment: 0 Vital Signs Height(in): 66 Pulse(bpm): 92 Weight(lbs): 252 Blood Pressure 100/55 (mmHg): Body Mass Index(BMI): 41 Temperature(F): 98.4 Respiratory Rate 18 (breaths/min): Photos: [1:No Photos] [N/A:N/A] Wound Location: [1:Right Achilles] [N/A:N/A] Wounding Event: [1:Trauma] [N/A:N/A] Primary Etiology: [1:Diabetic Wound/Ulcer of N/A the Lower Extremity] Secondary Etiology: [1:Trauma, Other]  [N/A:N/A] Comorbid History: [1:Sleep Apnea, Arrhythmia, N/A Type II Diabetes, Osteoarthritis, Neuropathy] Date Acquired: [1:02/20/2017] [N/A:N/A] Weeks of Treatment: [1:0] [N/A:N/A] Wound Status: [1:Open] [N/A:N/A] Measurements L x W x D 0.9x0.8x0.1 [N/A:N/A] (cm) Area (cm) : [1:0.565] [N/A:N/A] Volume (cm) : [1:0.057] [N/A:N/A] % Reduction in Area: [1:-348.40%] [N/A:N/A] % Reduction in Volume: -338.50% [N/A:N/A] Classification: [1:Grade 1] [N/A:N/A] Exudate Amount: [1:Medium] [N/A:N/A] Exudate Type: [1:Serous] [N/A:N/A] Exudate Color: [1:amber] [N/A:N/A] Wound Margin: [1:Flat and Intact] [N/A:N/A] Granulation Amount: [1:None Present (0%)] [N/A:N/A] Necrotic Amount: [1:Large (67-100%)] [N/A:N/A] Necrotic Tissue: [1:Eschar] [N/A:N/A] Exposed Structures: [1:Fascia: No Fat Layer (Subcutaneous Tissue) Exposed: No Tendon: No] [N/A:N/A] Muscle: No Joint: No Bone: No Epithelialization: None N/A N/A Debridement: Debridement (15400- N/A N/A 11047) Pre-procedure 08:49 N/A N/A Verification/Time Out Taken: Pain Control: Lidocaine 4% Topical N/A N/A Solution Tissue Debrided: Necrotic/Eschar, N/A N/A Fibrin/Slough, Subcutaneous Level: Skin/Subcutaneous N/A N/A Tissue Debridement Area (sq 0.72 N/A N/A cm): Instrument: Curette N/A N/A Bleeding: Minimum N/A N/A Hemostasis Achieved: Pressure N/A N/A Procedural Pain: 0 N/A  N/A Post Procedural Pain: 0 N/A N/A Debridement Treatment Procedure was tolerated N/A N/A Response: well Post Debridement 1x0.9x0.1 N/A N/A Measurements L x W x D (cm) Post Debridement 0.071 N/A N/A Volume: (cm) Periwound Skin Texture: Scarring: Yes N/A N/A Excoriation: No Induration: No Callus: No Crepitus: No Rash: No Periwound Skin Maceration: No N/A N/A Moisture: Dry/Scaly: No Periwound Skin Color: Erythema: Yes N/A N/A Atrophie Blanche: No Cyanosis: No Ecchymosis: No Hemosiderin Staining: No Mottled: No Pallor: No Rubor: No Erythema  Location: Circumferential N/A N/A Temperature: No Abnormality N/A N/A Tenderness on Yes N/A N/A Palpation: Corey [name], [name] ([id number]) Wound Preparation: Ulcer Cleansing: N/A N/A Rinsed/Irrigated with Saline Topical Anesthetic Applied: Other: lidocaine 4% Procedures Performed: Debridement N/A N/A Treatment Notes Electronic Signature(s) Signed: 03/22/2017 4:34:43 PM By: Linton Ham MD Entered By: Linton Ham on 03/22/2017 08:57:40 Corey [name], Corey [name] ([id number]) -------------------------------------------------------------------------------- Multi-Disciplinary Care Plan Details Patient Name: Corey [name], [name] Date of Service: 03/22/2017 8:00 AM Medical Record Number: [id number] Patient Account Number: [id number] Date of Birth/Sex: 1940-12-29 (76 y.o. Male) Treating RN: Montey Hora Primary Care Benjy Kana: Fulton Reek Other Clinician: Referring Aarion Metzgar: Fulton Reek Treating Shreshta Medley/Extender: Tito Dine in Treatment: 0 Active Inactive ` Abuse / Safety / Falls / Self Care Management Nursing Diagnoses: Impaired physical mobility Goals: Patient will not experience any injury related to falls Date Initiated: 03/22/2017 Target Resolution Date: 06/16/2017 Goal Status: Active Interventions: Assess fall risk on admission and as needed Notes: ` Orientation to the Wound Care Program Nursing Diagnoses: Knowledge deficit related to the wound healing center program Goals: Patient/caregiver will verbalize understanding of the [facility] Date Initiated: 03/22/2017 Target Resolution Date: 06/16/2017 Goal Status: Active Interventions: Provide education on orientation to the wound center Notes: ` Wound/Skin Impairment Nursing Diagnoses: Impaired tissue integrity Corey [name], [name] ([id number]) Goals: Ulcer/skin breakdown will heal within 14 weeks Date Initiated: 03/22/2017 Target Resolution Date: 06/16/2017 Goal Status:  Active Interventions: Assess patient/caregiver ability to obtain necessary supplies Assess patient/caregiver ability to perform ulcer/skin care regimen upon admission and as needed Assess ulceration(s) every visit Notes: Electronic Signature(s) Signed: 03/22/2017 4:37:06 PM By: Montey Hora Entered By: Montey Hora on 03/22/2017 08:51:41 Corey [name], Corey [name] L. ([id number]) -------------------------------------------------------------------------------- Pain Assessment Details Patient Name: Yontz, Brant L. Date of Service: 03/22/2017 8:00 AM Medical Record Number: [id number] Patient Account Number: [id number] Date of Birth/Sex: 13-May-1941 (76 y.o. Male) Treating RN: Montey Hora Primary Care Dvante Hands: Fulton Reek Other Clinician: Referring Lynette Noah: Fulton Reek Treating Lyana Asbill/Extender: Tito Dine in Treatment: 0 Active Problems Location of Pain Severity and Description of Pain Patient Has Paino No Site Locations Pain Management and Medication Current Pain Management: Notes Topical or injectable lidocaine is offered to patient for acute pain when surgical debridement is performed. If needed, Patient is instructed to use over the counter pain medication for the following 24-48 hours after debridement. Wound care MDs do not prescribed pain medications. Patient has chronic pain or uncontrolled pain. Patient has been instructed to make an appointment with their Primary Care Physician for pain management. Electronic Signature(s) Signed: 03/22/2017 4:37:06 PM By: Montey Hora Entered By: Montey Hora on 03/22/2017 08:18:29 Schafer, Clemens Catholic ([id number]) -------------------------------------------------------------------------------- Patient/Caregiver Education Details Patient Name: Corey [name], Majour L. Date of Service: 03/22/2017 8:00 AM Medical Record Number: [id number] Patient Account Number: [id number] Date of Birth/Gender: 08/22/1940 (76 y.o. Male) Treating RN: Montey Hora Primary Care Physician: Fulton Reek Other Clinician: Referring Physician: Fulton Reek Treating Physician/Extender: Tito Dine in Treatment: 0 Education Assessment Education Provided To:  Patient Education Topics Provided Wound/Skin Impairment: Handouts: Other: wound care as ordered Methods: Demonstration, Explain/Verbal Responses: State content correctly Electronic Signature(s) Signed: 03/22/2017 4:37:06 PM By: Montey Hora Entered By: Montey Hora on 03/22/2017 08:49:46 Abeytas, Temelec ([id number]) -------------------------------------------------------------------------------- Wound Assessment Details Patient Name: Mungin, Jonavan L. Date of Service: 03/22/2017 8:00 AM Medical Record Number: [id number] Patient Account Number: [id number] Date of Birth/Sex: January 09, 1941 (76 y.o. Male) Treating RN: Montey Hora Primary Care Clarece Drzewiecki: Fulton Reek Other Clinician: Referring Nilay Mangrum: Fulton Reek Treating Ellarie Picking/Extender: Ricard Dillon Weeks in Treatment: 0 Wound Status Wound Number: 1 Primary Diabetic Wound/Ulcer of the Lower Etiology: Extremity Wound Location: Right Achilles Secondary Trauma, Other Wounding Event: Trauma Etiology: Date Acquired: 02/20/2017 Wound Open Weeks Of Treatment: 0 Status: Clustered Wound: No Comorbid Sleep Apnea, Arrhythmia, Type II History: Diabetes, Osteoarthritis, Neuropathy Photos Photo Uploaded By: Gretta Cool, BSN, RN, CWS, Kim on 03/22/2017 16:35:01 Wound Measurements Length: (cm) 0.9 Width: (cm) 0.8 Depth: (cm) 0.1 Area: (cm) 0.565 Volume: (cm) 0.057 % Reduction in Area: -348.4% % Reduction in Volume: -338.5% Epithelialization: None Tunneling: No Undermining: No Wound Description Classification: Grade 1 Foul Odor Afte Wound Margin: Flat and Intact Slough/Fibrino Exudate Amount: Medium Exudate Type: Serous Exudate Color: amber r Cleansing: No No Wound Bed Granulation Amount: None Present  (0%) Exposed Structure Necrotic Amount: Large (67-100%) Fascia Exposed: No Necrotic Quality: Eschar Fat Layer (Subcutaneous Tissue) Exposed: No Uliano, Cassady L. ([id number]) Tendon Exposed: No Muscle Exposed: No Joint Exposed: No Bone Exposed: No Periwound Skin Texture Texture Color No Abnormalities Noted: No No Abnormalities Noted: No Callus: No Atrophie Blanche: No Crepitus: No Cyanosis: No Excoriation: No Ecchymosis: No Induration: No Erythema: Yes Rash: No Erythema Location: Circumferential Scarring: Yes Hemosiderin Staining: No Mottled: No Moisture Pallor: No No Abnormalities Noted: No Rubor: No Dry / Scaly: No Maceration: No Temperature / Pain Temperature: No Abnormality Tenderness on Palpation: Yes Wound Preparation Ulcer Cleansing: Rinsed/Irrigated with Saline Topical Anesthetic Applied: Other: lidocaine 4%, Treatment Notes Wound #1 (Right Achilles) 1. Cleansed with: Clean wound with Normal Saline 2. Anesthetic Topical Lidocaine 4% cream to wound bed prior to debridement 4. Dressing Applied: Prisma Ag 5. Secondary [place] Signature(s) Signed: 03/22/2017 4:37:06 PM By: Montey Hora Entered By: Montey Hora on 03/22/2017 08:53:27 Dolinar, Bunyan Carlean [name] ([id number]) -------------------------------------------------------------------------------- Vitals Details Patient Name: Corey [name], Mattthew L. Date of Service: 03/22/2017 8:00 AM Medical Record Number: [id number] Patient Account Number: [id number] Date of Birth/Sex: 1940/08/19 (76 y.o. Male) Treating RN: Montey Hora Primary Care Kandra Graven: Fulton Reek Other Clinician: Referring Ngai Parcell: Fulton Reek Treating Gurney Balthazor/Extender: Tito Dine in Treatment: 0 Vital Signs Time Taken: 08:20 Temperature (F): 98.4 Height (in): 66 Pulse (bpm): 92 Source: Measured Respiratory Rate (breaths/min): 18 Weight (lbs): 252 Blood Pressure (mmHg): 100/55 Source:  Measured Reference Range: 80 - 120 mg / dl Body Mass Index (BMI): 40.7 Electronic Signature(s) Signed: 03/22/2017 4:37:06 PM By: Montey Hora Entered By: Montey Hora on 03/22/2017 08:24:09

## 2017-03-29 ENCOUNTER — Encounter: Payer: PPO | Admitting: Internal Medicine

## 2017-03-29 DIAGNOSIS — E11622 Type 2 diabetes mellitus with other skin ulcer: Secondary | ICD-10-CM | POA: Diagnosis not present

## 2017-03-29 DIAGNOSIS — S91011A Laceration without foreign body, right ankle, initial encounter: Secondary | ICD-10-CM | POA: Diagnosis not present

## 2017-03-29 DIAGNOSIS — I872 Venous insufficiency (chronic) (peripheral): Secondary | ICD-10-CM | POA: Diagnosis not present

## 2017-03-31 NOTE — Progress Notes (Signed)
Corey, [name] ([id number]) Visit Report for 03/29/2017 HPI Details Patient Name: Corey [name], [name] Date of Service: 03/29/2017 9:15 AM Medical Record Number: [id number] Patient Account Number: [id number] Date of Birth/Sex: [birth year] (76 y.o. Male) Treating RN: Montey Hora Primary Care Provider: Fulton Reek Other Clinician: Referring Provider: Fulton Reek Treating Provider/Extender: Tito Dine in Treatment: 1 History of Present Illness HPI Description: 03/22/17; patient is a 76 year old type II diabetic not on insulin. He has no prior wound history. Roughly 5-6 weeks ago he was out working outside had a dog on a chain which eventually wrapped around his ankle causing a fairly significant laceration over the mid part of his right Achilles. The patient tried to treat this himself with topical peroxide however he was ultimately seen in his primary physician's office on 02/23/17 felt to have a wound infection. Treated with Bactroban and 10 days of Ceftin. He was seen again in follow-up on 03/09/17 still felt to be infected and put on Septra DS twice a day for 10 days which she is completed. This hasn't resulted in marked improvement of the erythema which at one point was spreading into his anterior foot. The patient does not have a known history of PAD or claudication. No wound care history. He is a nonsmoker. 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 fib on Xarelto, type 2 diabetes on oral agents, gout including involvement of the right ankle. I note a call yesterday from his wife to his primary doctor's office in [place] with fever and chills. He was seen his primary doctor's office and given a macrolide. He feels better today. He is still applying peroxide to the wound surface. ABI in this clinic 1.16 on the right and 1.23 on the left. 03/29/17; patient came to the clinic last week with a laceration on the left Achilles area. He is a type  II diabetic. He states after he was here in the clinic last week he developed small red rash on his bilateral lower extremities, on-call doctor ordered doxycycline for him. I don't see any evidence of that currently Electronic Signature(s) Signed: 03/29/2017 5:33:43 PM By: Linton Ham MD Entered By: Linton Ham on 03/29/2017 10:09:55 [place], Clemens Catholic ([id number]) -------------------------------------------------------------------------------- Physical Exam Details Patient Name: Corey, [name] Date of Service: 03/29/2017 9:15 AM Medical Record Number: [id number] Patient Account Number: [id number] Date of Birth/Sex: 1941-03-14 (76 y.o. Male) Treating RN: Montey Hora Primary Care Provider: Fulton Reek Other Clinician: Referring Provider: Fulton Reek Treating Provider/Extender: Ricard Dillon Weeks in Treatment: 1 Constitutional Sitting or standing Blood Pressure is within target range for patient.. Pulse regular and within target range for patient.Marland Kitchen Respirations regular, non-labored and within target range.. Temperature is normal and within the target range for the patient.Marland Kitchen appears in no distress. Cardiovascular Pedal pulses palpable and strong bilaterally.. Minimal edema. Integumentary (Hair, Skin) Chronic venous insufficiency with hemosiderin deposition although I see no rash. Notes 9/90/18; this wound is fully epithelialized with immature skin. I think this is going to close over by next week. Electronic Signature(s) Signed: 03/29/2017 5:33:43 PM By: Linton Ham MD Entered By: Linton Ham on 03/29/2017 10:16:03 [place], Clemens Catholic ([id number]) -------------------------------------------------------------------------------- Physician Orders Details Patient Name: Corey Raya, [name] Date of Service: 03/29/2017 9:15 AM Medical Record Number: [id number] Patient Account Number: [id number] Date of Birth/Sex: 02/05/1941 (76 y.o. Male) Treating RN: Montey Hora Primary Care  Provider: Fulton Reek Other Clinician: Referring Provider: Fulton Reek Treating Provider/Extender: Ricard Dillon Weeks in  Treatment: 1 Verbal / Phone Orders: No Diagnosis Coding Wound Cleansing Wound #1 Right Achilles o Clean wound with Normal Saline. o May Shower, gently pat wound dry prior to applying new dressing. Anesthetic Wound #1 Right Achilles o Topical Lidocaine 4% cream applied to wound bed prior to debridement Secondary Dressing Wound #1 Right Achilles o Boardered Foam Dressing - or telfa island dressing o Non-adherent pad Dressing Change Frequency Wound #1 Right Achilles o Change dressing every other day. Follow-up Appointments Wound #1 Right Achilles o Return Appointment in 1 week. Edema Control Wound #1 Right Achilles o Elevate legs to the level of the heart and pump ankles as often as possible Additional Orders / Instructions Wound #1 Right Achilles o Increase protein intake. Electronic Signature(s) Signed: 03/29/2017 5:33:43 PM By: Linton Ham MD Signed: 03/30/2017 4:38:09 PM By: [place], Clemens Catholic ([id number]) Entered By: Montey Hora on 03/29/2017 09:42:52 [place], Anna ([id number]) -------------------------------------------------------------------------------- Problem List Details Patient Name: [name], [name] Date of Service: 03/29/2017 9:15 AM Medical Record Number: [id number] Patient Account Number: [id number] Date of Birth/Sex: [birth year] (76 y.o. 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 ([id number]) -------------------------------------------------------------------------------- Progress Note Details Patient Name: Kathan, Kinsler L. Date of Service: 03/29/2017 9:15 AM Medical Record Number: [id number] Patient Account Number: [id number] Date of Birth/Sex: 1940-10-04 (76 y.o. Male) Treating RN: Montey Hora Primary Care Provider: Fulton Reek Other Clinician: Referring Provider: Fulton Reek Treating Provider/Extender: Ricard Dillon Weeks in Treatment: 1 Subjective History of Present Illness (HPI) 03/22/17; patient is a 76 year old type II diabetic not on insulin. He has no prior wound history. Roughly 5-6 weeks ago he was out working outside had a dog on a chain which eventually wrapped around his ankle causing a fairly significant laceration over the mid part of his right Achilles. The patient tried to treat this himself with topical peroxide however he was ultimately seen in his primary physician's office on 02/23/17 felt to have a wound infection. Treated with Bactroban and 10 days of Ceftin. He was seen again in follow-up on 03/09/17 still felt to be infected and put on Septra DS twice a day for 10 days which she is completed. This hasn't resulted in marked improvement of the erythema which at one point was spreading into his anterior foot. The patient does not have a known history of PAD or claudication. No wound care history. He is a nonsmoker. 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 fib on Xarelto, type 2 diabetes on oral agents, gout including involvement of the right ankle. I note a call yesterday from his wife to his primary doctor's office in [place] with fever and chills. He was seen his primary doctor's office and given a macrolide. He feels better today. He is still applying  peroxide to the wound surface. ABI in this clinic 1.16 on the right and 1.23 on the left. 03/29/17; patient came to the clinic last week with a laceration on the left Achilles area. He is a type II diabetic. He states after  he was here in the clinic last week he developed small red rash on his bilateral lower extremities, on-call doctor ordered doxycycline for him. I don't see any evidence of that currently Objective Constitutional Sitting or standing Blood Pressure is within target range for patient.. Pulse regular and within target range for patient.Marland Kitchen Respirations regular, non-labored and within target range.. Temperature is normal and within the target range for the patient.Marland Kitchen appears in no distress. Vitals Time Taken: 9:14 AM, Height: 66 in, Weight: 252 lbs, BMI: 40.7, Temperature: 98.7 F, Pulse: 76 bpm, Respiratory Rate: 18 breaths/min, Blood Pressure: 121/56 mmHg. Filosa, Xavior L. ([id number]) Cardiovascular Pedal pulses palpable and strong bilaterally.. Minimal edema. General Notes: 9/90/18; this wound is fully epithelialized with immature skin. I think this is going to close over by next week. Integumentary (Hair, Skin) Chronic venous insufficiency with hemosiderin deposition although I see no rash. Wound #1 status is Open. Original cause of wound was Trauma. The wound is located on the Right Achilles. The wound measures 0.2cm length x 0.3cm width x 0.1cm depth; 0.047cm^2 area and 0.005cm^3 volume. There is no tunneling or undermining noted. There is a medium amount of serous drainage noted. The wound margin is flat and intact. There is large (67-100%) pink granulation within the wound bed. There is no necrotic tissue within the wound bed. The periwound skin appearance exhibited: Scarring, Erythema. The periwound skin appearance did not exhibit: Callus, Crepitus, Excoriation, Induration, Rash, Dry/Scaly, Maceration, Atrophie Blanche, Cyanosis, Ecchymosis, Hemosiderin Staining, Mottled,  Pallor, Rubor. The surrounding wound skin color is noted with erythema which is circumferential. Periwound temperature was noted as No Abnormality. The periwound has tenderness on palpation. 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 May Shower, gently pat wound dry prior to applying new dressing. Anesthetic: Wound #1 Right Achilles: Topical Lidocaine 4% cream applied to wound bed prior to debridement Secondary Dressing: Wound #1 Right Achilles: Boardered Foam Dressing - or telfa island dressing Bryngelson, Olusegun L. ([id number]) Non-adherent pad Dressing Change Frequency: Wound #1 Right Achilles: Change dressing every other day. Follow-up Appointments: Wound #1 Right Achilles: Return Appointment in 1 week. Edema Control: Wound #1 Right Achilles: Elevate legs to the level of the heart and pump ankles as often as possible Additional Orders / Instructions: Wound #1 Right Achilles: Increase protein intake. #1 report border foam on this and I'll see this back next week but I think this should be healed at that point Electronic Signature(s) Signed: 03/29/2017 5:33:43 PM By: Linton Ham MD Entered By: Linton Ham on 03/29/2017 10:16:37 [place], [place] ([id number]) -------------------------------------------------------------------------------- SuperBill Details Patient Name: Gohr, Otho L. Date of Service: 03/29/2017 Medical Record Number: [id number] Patient Account Number: [id number] Date of Birth/Sex: [birth year] (76 y.o. 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

## 2017-03-31 NOTE — Progress Notes (Signed)
BOBAK, OGUINN ([id number]) Visit Report for 03/29/2017 Arrival Information Details Patient Name: Corey [name], Corey [name]. Date of Service: 03/29/2017 9:15 AM Medical Record Number: [id number] Patient Account Number: [id number] Date of Birth/Sex: 11-19-1940 (76 y.o. Male) Treating RN: Montey Hora Primary Care Natina Wiginton: Fulton Reek Other Clinician: Referring Evalyn Shultis: Fulton Reek Treating Amala Petion/Extender: Tito Dine in Treatment: 1 Visit Information History Since Last Visit Added or deleted any medications: No Patient Arrived: Ambulatory Any new allergies or adverse reactions: No Arrival Time: 09:13 Had a fall or experienced change in No Accompanied By: self activities of daily living that may affect Transfer Assistance: None risk of falls: Patient Identification Verified: Yes Signs or symptoms of abuse/neglect since last No Secondary Verification Process Yes visito Completed: Hospitalized since last visit: No Patient Has Alerts: Yes Has Dressing in Place as Prescribed: Yes Patient Alerts: Patient on Blood Pain Present Now: No Thinner xarelto Electronic Signature(s) Signed: 03/30/2017 4:38:09 PM By: Montey Hora Entered By: Montey Hora on 03/29/2017 09:13:45 Ducharme, Demari L. ([id number]) -------------------------------------------------------------------------------- Clinic Level of Care Assessment Details Patient Name: Ballin, Axl L. Date of Service: 03/29/2017 9:15 AM Medical Record Number: [id number] Patient Account Number: [id number] Date of Birth/Sex: 10-Jan-1941 (76 y.o. Male) Treating RN: Montey Hora Primary Care Squire Withey: Fulton Reek Other Clinician: Referring Khaleesi Gruel: Fulton Reek Treating Mirage Pfefferkorn/Extender: Tito Dine in Treatment: 1 Clinic Level of Care Assessment Items TOOL 4 Quantity Score []  - Use when only an EandM is performed on FOLLOW-UP visit 0 ASSESSMENTS - Nursing Assessment / Reassessment X - Reassessment of  Co-morbidities (includes updates in patient status) 1 10 X - Reassessment of Adherence to Treatment Plan 1 5 ASSESSMENTS - Wound and Skin Assessment / Reassessment X - Simple Wound Assessment / Reassessment - one wound 1 5 []  - Complex Wound Assessment / Reassessment - multiple wounds 0 []  - Dermatologic / Skin Assessment (not related to wound area) 0 ASSESSMENTS - Focused Assessment []  - Circumferential Edema Measurements - multi extremities 0 []  - Nutritional Assessment / Counseling / Intervention 0 X - Lower Extremity Assessment (monofilament, tuning fork, pulses) 1 5 []  - Peripheral Arterial Disease Assessment (using hand held doppler) 0 ASSESSMENTS - Ostomy and/or Continence Assessment and Care []  - Incontinence Assessment and Management 0 []  - Ostomy Care Assessment and Management (repouching, etc.) 0 PROCESS - Coordination of Care X - Simple Patient / Family Education for ongoing care 1 15 []  - Complex (extensive) Patient / Family Education for ongoing care 0 []  - Staff obtains Programmer, systems, Records, Test Results / Process Orders 0 []  - Staff telephones HHA, Nursing Homes / Clarify orders / etc 0 []  - Routine Transfer to another Facility (non-emergent condition) 0 Seier, Carlitos L. ([id number]) []  - Routine Hospital Admission (non-emergent condition) 0 []  - New Admissions / Biomedical engineer / Ordering NPWT, Apligraf, etc. 0 []  - Emergency Hospital Admission (emergent condition) 0 X - Simple Discharge Coordination 1 10 []  - Complex (extensive) Discharge Coordination 0 PROCESS - Special Needs []  - Pediatric / Minor Patient Management 0 []  - Isolation Patient Management 0 []  - Hearing / Language / Visual special needs 0 []  - Assessment of Community assistance (transportation, D/C planning, etc.) 0 []  - Additional assistance / Altered mentation 0 []  - Support Surface(s) Assessment (bed, cushion, seat, etc.) 0 INTERVENTIONS - Wound Cleansing / Measurement X - Simple Wound  Cleansing - one wound 1 5 []  - Complex Wound Cleansing - multiple wounds 0 X - Wound Imaging (photographs - any number of wounds)  1 5 []  - Wound Tracing (instead of photographs) 0 X - Simple Wound Measurement - one wound 1 5 []  - Complex Wound Measurement - multiple wounds 0 INTERVENTIONS - Wound Dressings X - Small Wound Dressing one or multiple wounds 1 10 []  - Medium Wound Dressing one or multiple wounds 0 []  - Large Wound Dressing one or multiple wounds 0 []  - Application of Medications - topical 0 []  - Application of Medications - injection 0 INTERVENTIONS - Miscellaneous []  - External ear exam 0 Sellers, Jerardo L. ([id number]) []  - Specimen Collection (cultures, biopsies, blood, body fluids, etc.) 0 []  - Specimen(s) / Culture(s) sent or taken to Lab for analysis 0 []  - Patient Transfer (multiple staff / Harrel Lemon Lift / Similar devices) 0 []  - Simple Staple / Suture removal (25 or less) 0 []  - Complex Staple / Suture removal (26 or more) 0 []  - Hypo / Hyperglycemic Management (close monitor of Blood Glucose) 0 []  - Ankle / Brachial Index (ABI) - do not check if billed separately 0 X - Vital Signs 1 5 Has the patient been seen at the hospital within the last three years: Yes Total Score: 80 Level Of Care: New/Established - Level 3 Electronic Signature(s) Signed: 03/30/2017 4:38:09 PM By: Montey Hora Entered By: Montey Hora on 03/29/2017 17:05:13 [facility], [place]. ([id number]) -------------------------------------------------------------------------------- Encounter Discharge Information Details Patient Name: Corey Raya, Karry L. Date of Service: 03/29/2017 9:15 AM Medical Record Number: [id number] Patient Account Number: [id number] Date of Birth/Sex: 09/27/1940 (76 y.o. Male) Treating RN: Montey Hora Primary Care Assata Juncaj: Fulton Reek Other Clinician: Referring Carmelle Bamberg: Fulton Reek Treating Lanny Donoso/Extender: Tito Dine in Treatment: 1 Encounter Discharge  Information Items Discharge Pain Level: 0 Discharge Condition: Stable Ambulatory Status: Ambulatory Discharge Destination: Home Transportation: Private Auto Accompanied By: self Schedule Follow-up Appointment: Yes Medication Reconciliation completed and provided to Patient/Care No Katilyn Miltenberger: Provided on Clinical Summary of Care: 03/29/2017 Form Type Recipient Paper Patient HP Electronic Signature(s) Signed: 03/30/2017 9:09:16 AM By: Ruthine Dose Entered By: Ruthine Dose on 03/29/2017 09:49:17 Seubert, Gust L. ([id number]) -------------------------------------------------------------------------------- Lower Extremity Assessment Details Patient Name: Umana, Alexavier L. Date of Service: 03/29/2017 9:15 AM Medical Record Number: [id number] Patient Account Number: [id number] Date of Birth/Sex: [birth year] (76 y.o. Male) Treating RN: Montey Hora Primary Care Aldridge Krzyzanowski: Fulton Reek Other Clinician: Referring Angas Isabell: Fulton Reek Treating Charlen Bakula/Extender: Tito Dine in Treatment: 1 Vascular Assessment Pulses: Dorsalis Pedis Palpable: [Right:Yes] Posterior Tibial Extremity colors, hair growth, and conditions: Extremity Color: [Right:Hyperpigmented] Hair Growth on Extremity: [Right:No] Temperature of Extremity: [Right:Warm] Capillary Refill: [Right:< 3 seconds] Electronic Signature(s) Signed: 03/30/2017 4:38:09 PM By: Montey Hora Entered By: Montey Hora on 03/29/2017 09:19:57 [place], [place] ([id number]) -------------------------------------------------------------------------------- Multi Wound Chart Details Patient Name: Verdun, Ayvin L. Date of Service: 03/29/2017 9:15 AM Medical Record Number: [id number] Patient Account Number: [id number] Date of Birth/Sex: 1941/04/29 (76 y.o. Male) Treating RN: Montey Hora Primary Care Ashaunti Treptow: Fulton Reek Other Clinician: Referring Hiilani Jetter: Fulton Reek Treating Nohlan Burdin/Extender: Ricard Dillon Weeks  in Treatment: 1 Vital Signs Height(in): 66 Pulse(bpm): 76 Weight(lbs): 252 Blood Pressure 121/56 (mmHg): Body Mass Index(BMI): 41 Temperature(F): 98.7 Respiratory Rate 18 (breaths/min): Photos: [1:No Photos] [N/A:N/A] Wound Location: [1:Right Achilles] [N/A:N/A] Wounding Event: [1:Trauma] [N/A:N/A] Primary Etiology: [1:Diabetic Wound/Ulcer of N/A the Lower Extremity] Secondary Etiology: [1:Trauma, Other] [N/A:N/A] Comorbid History: [1:Sleep Apnea, Arrhythmia, N/A Type II Diabetes, Osteoarthritis, Neuropathy] Date Acquired: [1:02/20/2017] [N/A:N/A] Weeks of Treatment: [1:1] [N/A:N/A] Wound Status: [1:Open] [N/A:N/A] Measurements L x W x D 0.2x0.3x0.1 [N/A:N/A] (cm) Area (  cm) : [1:0.047] [N/A:N/A] Volume (cm) : [1:0.005] [N/A:N/A] % Reduction in Area: [1:91.70%] [N/A:N/A] % Reduction in Volume: 91.20% [N/A:N/A] Classification: [1:Grade 1] [N/A:N/A] Exudate Amount: [1:Medium] [N/A:N/A] Exudate Type: [1:Serous] [N/A:N/A] Exudate Color: [1:amber] [N/A:N/A] Wound Margin: [1:Flat and Intact] [N/A:N/A] Granulation Amount: [1:Large (67-100%)] [N/A:N/A] Granulation Quality: [1:Pink] [N/A:N/A] Necrotic Amount: [1:None Present (0%)] [N/A:N/A] Exposed Structures: [1:Fascia: No Fat Layer (Subcutaneous Tissue) Exposed: No Tendon: No] [N/A:N/A] Muscle: No Joint: No Bone: No Epithelialization: Medium (34-66%) N/A N/A Periwound Skin Texture: Scarring: Yes N/A N/A Excoriation: No Induration: No Callus: No Crepitus: No Rash: No Periwound Skin Maceration: No N/A N/A Moisture: Dry/Scaly: No Periwound Skin Color: Erythema: Yes N/A N/A Atrophie Blanche: No Cyanosis: No Ecchymosis: No Hemosiderin Staining: No Mottled: No Pallor: No Rubor: No Erythema Location: Circumferential N/A N/A Temperature: No Abnormality N/A N/A Tenderness on Yes N/A N/A Palpation: Wound Preparation: Ulcer Cleansing: N/A N/A Rinsed/Irrigated with Saline Topical Anesthetic Applied: None Treatment  Notes Electronic Signature(s) Signed: 03/29/2017 5:33:43 PM By: Linton Ham MD Entered By: Linton Ham on 03/29/2017 10:08:44 Rufino, Clemens Catholic ([id number]) -------------------------------------------------------------------------------- Multi-Disciplinary Care Plan Details Patient Name: Corey Raya, Kalyn L. Date of Service: 03/29/2017 9:15 AM Medical Record Number: [id number] Patient Account Number: [id number] Date of Birth/Sex: Jan 04, 1941 (76 y.o. Male) Treating RN: Montey Hora Primary Care Manan Olmo: Fulton Reek Other Clinician: Referring Keldrick Pomplun: Fulton Reek Treating Glendon Fiser/Extender: Tito Dine in Treatment: 1 Active Inactive ` Abuse / Safety / Falls / Self Care Management Nursing Diagnoses: Impaired physical mobility Goals: Patient will not experience any injury related to falls Date Initiated: 03/22/2017 Target Resolution Date: 06/16/2017 Goal Status: Active Interventions: Assess fall risk on admission and as needed Notes: ` Orientation to the Wound Care Program Nursing Diagnoses: Knowledge deficit related to the wound healing center program Goals: Patient/caregiver will verbalize understanding of the [place] Program Date Initiated: 03/22/2017 Target Resolution Date: 06/16/2017 Goal Status: Active Interventions: Provide education on orientation to the wound center Notes: ` Wound/Skin Impairment Nursing Diagnoses: Impaired tissue integrity Maestre, Lenox L. ([id number]) Goals: Ulcer/skin breakdown will heal within 14 weeks Date Initiated: 03/22/2017 Target Resolution Date: 06/16/2017 Goal Status: Active Interventions: Assess patient/caregiver ability to obtain necessary supplies Assess patient/caregiver ability to perform ulcer/skin care regimen upon admission and as needed Assess ulceration(s) every visit Notes: Electronic Signature(s) Signed: 03/30/2017 4:38:09 PM By: Montey Hora Entered By: Montey Hora on 03/29/2017  09:20:02 [place], [place]. ([id number]) -------------------------------------------------------------------------------- Pain Assessment Details Patient Name: Gable, David L. Date of Service: 03/29/2017 9:15 AM Medical Record Number: [id number] Patient Account Number: [id number] Date of Birth/Sex: [birth year] (76 y.o. Male) Treating RN: Montey Hora Primary Care Harmonii Karle: Fulton Reek Other Clinician: Referring Jewelz Ricklefs: Fulton Reek Treating Tinisha Etzkorn/Extender: Tito Dine in Treatment: 1 Active Problems Location of Pain Severity and Description of Pain Patient Has Paino No Site Locations Pain Management and Medication Current Pain Management: Notes Topical or injectable lidocaine is offered to patient for acute pain when surgical debridement is performed. If needed, Patient is instructed to use over the counter pain medication for the following 24-48 hours after debridement. Wound care MDs do not prescribed pain medications. Patient has chronic pain or uncontrolled pain. Patient has been instructed to make an appointment with their Primary Care Physician for pain management. Electronic Signature(s) Signed: 03/30/2017 4:38:09 PM By: Montey Hora Entered By: Montey Hora on 03/29/2017 09:13:55 Likins, English Carlean Jews ([id number]) -------------------------------------------------------------------------------- Patient/Caregiver Education Details Patient Name: Corey Raya, Quill L. Date of Service: 03/29/2017 9:15 AM Medical Record Number: [id number] Patient Account Number: [id number] Date  of Birth/Gender: July 11, 1941 (76 y.o. Male) Treating RN: Montey Hora Primary Care Physician: Fulton Reek Other Clinician: Referring Physician: Fulton Reek Treating Physician/Extender: Tito Dine in Treatment: 1 Education Assessment Education Provided To: Patient Education Topics Provided Wound/Skin Impairment: Handouts: Other: wound care as ordered Methods:  Demonstration, Explain/Verbal Responses: State content correctly Electronic Signature(s) Signed: 03/30/2017 4:38:09 PM By: Montey Hora Entered By: Montey Hora on 03/29/2017 09:41:48 Hogle, Eryx L. ([id number]) -------------------------------------------------------------------------------- Wound Assessment Details Patient Name: Ambs, Jaziah L. Date of Service: 03/29/2017 9:15 AM Medical Record Number: [id number] Patient Account Number: [id number] Date of Birth/Sex: [birth year] (76 y.o. Male) Treating RN: Montey Hora Primary Care Yesika Rispoli: Fulton Reek Other Clinician: Referring Kristine Tiley: Fulton Reek Treating Avi Archuleta/Extender: Ricard Dillon Weeks in Treatment: 1 Wound Status Wound Number: 1 Primary Diabetic Wound/Ulcer of the Lower Etiology: Extremity Wound Location: Right Achilles Secondary Trauma, Other Wounding Event: Trauma Etiology: Date Acquired: 02/20/2017 Wound Open Weeks Of Treatment: 1 Status: Clustered Wound: No Comorbid Sleep Apnea, Arrhythmia, Type II History: Diabetes, Osteoarthritis, Neuropathy Photos Photo Uploaded By: Montey Hora on 03/29/2017 16:51:26 Wound Measurements Length: (cm) 0.2 Width: (cm) 0.3 Depth: (cm) 0.1 Area: (cm) 0.047 Volume: (cm) 0.005 % Reduction in Area: 91.7% % Reduction in Volume: 91.2% Epithelialization: Medium (34-66%) Tunneling: No Undermining: No Wound Description Classification: Grade 1 Wound Margin: Flat and Intact Exudate Amount: Medium Exudate Type: Serous Exudate Color: amber Foul Odor After Cleansing: No Slough/Fibrino No Wound Bed Granulation Amount: Large (67-100%) Exposed Structure Granulation Quality: Pink Fascia Exposed: No Necrotic Amount: None Present (0%) Fat Layer (Subcutaneous Tissue) Exposed: No Heiden, Trashawn L. ([id number]) Tendon Exposed: No Muscle Exposed: No Joint Exposed: No Bone Exposed: No Periwound Skin Texture Texture Color No Abnormalities Noted: No No  Abnormalities Noted: No Callus: No Atrophie Blanche: No Crepitus: No Cyanosis: No Excoriation: No Ecchymosis: No Induration: No Erythema: Yes Rash: No Erythema Location: Circumferential Scarring: Yes Hemosiderin Staining: No Mottled: No Moisture Pallor: No No Abnormalities Noted: No Rubor: No Dry / Scaly: No Maceration: No Temperature / Pain Temperature: No Abnormality Tenderness on Palpation: Yes Wound Preparation Ulcer Cleansing: Rinsed/Irrigated with Saline Topical Anesthetic Applied: None Treatment Notes Wound #1 (Right Achilles) 1. Cleansed with: Clean wound with Normal Saline 4. Dressing Applied: Non-Adherent gauze 5. Secondary [place] Signature(s) Signed: 03/30/2017 4:38:09 PM By: Montey Hora Entered By: Montey Hora on 03/29/2017 09:19:27 [place], Abdulwahab Carlean Jews ([id number]) -------------------------------------------------------------------------------- [place] Details Patient Name: Corey Raya, Marbin L. Date of Service: 03/29/2017 9:15 AM Medical Record Number: [id number] Patient Account Number: [id number] Date of Birth/Sex: 01-26-1941 (76 y.o. Male) Treating RN: Montey Hora Primary Care Maisy Newport: Fulton Reek Other Clinician: Referring Taisia Fantini: Fulton Reek Treating Kanon Colunga/Extender: Tito Dine in Treatment: 1 Vital Signs Time Taken: 09:14 Temperature (F): 98.7 Height (in): 66 Pulse (bpm): 76 Weight (lbs): 252 Respiratory Rate (breaths/min): 18 Body Mass Index (BMI): 40.7 Blood Pressure (mmHg): 121/56 Reference Range: 80 - 120 mg / dl Electronic Signature(s) Signed: 03/30/2017 4:38:09 PM By: Montey Hora Entered By: Montey Hora on 03/29/2017 09:16:20

## 2017-04-05 ENCOUNTER — Encounter: Payer: PPO | Admitting: Internal Medicine

## 2017-04-05 DIAGNOSIS — E11622 Type 2 diabetes mellitus with other skin ulcer: Secondary | ICD-10-CM | POA: Diagnosis not present

## 2017-04-05 DIAGNOSIS — S91011A Laceration without foreign body, right ankle, initial encounter: Secondary | ICD-10-CM | POA: Diagnosis not present

## 2017-04-05 DIAGNOSIS — E11628 Type 2 diabetes mellitus with other skin complications: Secondary | ICD-10-CM | POA: Diagnosis not present

## 2017-04-05 DIAGNOSIS — I872 Venous insufficiency (chronic) (peripheral): Secondary | ICD-10-CM | POA: Diagnosis not present

## 2017-04-05 DIAGNOSIS — R600 Localized edema: Secondary | ICD-10-CM | POA: Diagnosis not present

## 2017-04-06 NOTE — Progress Notes (Signed)
TRENT, GABLER ([id number]) Visit Report for 04/05/2017 HPI Details Patient Name: Corey [name], [name] Date of Service: 04/05/2017 11:00 AM Medical Record Number: [id number] Patient Account Number: [id number] Date of Birth/Sex: 09-10-1940 (76 y.o. Male) Treating RN: Cornell Barman Primary Care Provider: Fulton Reek Other Clinician: Referring Provider: Fulton Reek Treating Provider/Extender: Tito Dine in Treatment: 2 History of Present Illness HPI Description: 03/22/17; patient is a 76 year old type II diabetic not on insulin. He has no prior wound history. Roughly 5-6 weeks ago he was out working outside had a dog on a chain which eventually wrapped around his ankle causing a fairly significant laceration over the mid part of his right Achilles. The patient tried to treat this himself with topical peroxide however he was ultimately seen in his primary physician's office on 02/23/17 felt to have a wound infection. Treated with Bactroban and 10 days of Ceftin. He was seen again in follow-up on 03/09/17 still felt to be infected and put on Septra DS twice a day for 10 days which she is completed. This hasn't resulted in marked improvement of the erythema which at one point was spreading into his anterior foot. The patient does not have a known history of PAD or claudication. No wound care history. He is a nonsmoker. 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 fib on Xarelto, type 2 diabetes on oral agents, gout including involvement of the right ankle. I note a call yesterday from his wife to his primary doctor's office in [place] with fever and chills. He was seen his primary doctor's office and given a macrolide. He feels better today. He is still applying peroxide to the wound surface. ABI in this clinic 1.16 on the right and 1.23 on the left. 03/29/17; patient came to the clinic last week with a laceration on the left Achilles area. He is a type  II diabetic. He states after he was here in the clinic last week he developed small red rash on his bilateral lower extremities, on-call doctor ordered doxycycline for him. I don't see any evidence of that currently. 04/05/17; traumatic wound on the Achilles area has totally resolved and is epithelialized. He is a type II diabetic and has chronic venous insufficiency with some degree of hemosiderin deposition and chronic edema. Electronic Signature(s) Signed: 04/05/2017 4:28:59 PM By: Linton Ham MD Entered By: Linton Ham on 04/05/2017 11:15:52 Corey [name], Corey [name] ([id number]) -------------------------------------------------------------------------------- Physical Exam Details Patient Name: Coach, Simeon L. Date of Service: 04/05/2017 11:00 AM Medical Record Number: [id number] Patient Account Number: [id number] Date of Birth/Sex: 21-Jan-1941 (76 y.o. Male) Treating RN: Cornell Barman Primary Care Provider: Fulton Reek Other Clinician: Referring Provider: Fulton Reek Treating Provider/Extender: Ricard Dillon Weeks in Treatment: 2 Constitutional Sitting or standing Blood Pressure is within target range for patient.. Pulse regular and within target range for patient.Marland Kitchen Respirations regular, non-labored and within target range.. Temperature is normal and within the target range for the patient.Marland Kitchen appears in no distress. Eyes Conjunctivae clear. No discharge. Respiratory Respiratory effort is easy and symmetric bilaterally. Rate is normal at rest and on room air.. Cardiovascular Pedal pulses present.. Minimal edema in the ankle area. Hemosiderin deposition. Integumentary (Hair, Skin) no rash. Psychiatric No evidence of depression, anxiety, or agitation. Calm, cooperative, and communicative. Appropriate interactions and affect.. Notes Wound exam the area is fully epithelialized and looks more mature than last week. I think he can be discharged. I don't believe there is going to be  anything in terms of secondary prevention at this point although with his degree of venous insufficiency that may become necessary. We had some talk about compression stockings Electronic Signature(s) Signed: 04/05/2017 4:28:59 PM By: Linton Ham MD Entered By: Linton Ham on 04/05/2017 11:18:25 Corey [name], Corey [name] ([id number]) -------------------------------------------------------------------------------- Physician Orders Details Patient Name: Corey [name], [name] Date of Service: 04/05/2017 11:00 AM Medical Record Number: [id number] Patient Account Number: [id number] Date of Birth/Sex: December 15, 1940 (76 y.o. Male) Treating RN: Cornell Barman Primary Care Provider: Fulton Reek Other Clinician: Referring Provider: Fulton Reek Treating Provider/Extender: Tito Dine in Treatment: 2 Verbal / Phone Orders: No Diagnosis Coding Discharge From [facility] Services o Discharge from [place] Signature(s) Signed: 04/05/2017 1:35:33 PM By: Gretta Cool, BSN, RN, CWS, Kim RN, BSN Signed: 04/05/2017 4:28:59 PM By: Linton Ham MD Entered By: Gretta Cool, BSN, RN, CWS, Kim on 04/05/2017 11:11:07 Corey [name], Corey [name] ([id number]) -------------------------------------------------------------------------------- Problem List Details Patient Name: Corey [name], [name] Date of Service: 04/05/2017 11:00 AM Medical Record Number: [id number] Patient Account Number: [id number] Date of Birth/Sex: Sep 06, 1940 (76 y.o. 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 ([id number]) -------------------------------------------------------------------------------- Progress Note Details Patient Name: Corey [name], [name] Date of Service: 04/05/2017 11:00 AM Medical Record Number: [id number] Patient Account Number: [id number] Date of Birth/Sex: 29-Jan-1941 (76 y.o. Male) Treating RN: Cornell Barman Primary Care Provider: Fulton Reek Other Clinician: Referring Provider: Fulton Reek Treating Provider/Extender: Ricard Dillon Weeks in Treatment: 2 Subjective History of Present Illness (HPI) 03/22/17; patient is a 76 year old type II diabetic not on insulin. He has no prior wound history. Roughly 5-6 weeks ago he was out working outside had a dog on a chain which eventually wrapped around his ankle causing a fairly significant laceration over the mid part of his right Achilles. The patient tried to treat this himself with topical peroxide however he was ultimately seen in his primary physician's office on 02/23/17 felt to have a wound infection. Treated with Bactroban and 10 days of Ceftin. He was seen again in follow-up on 03/09/17 still felt to be infected and put on Septra DS twice a day for 10 days which she is completed. This hasn't resulted in marked improvement of the erythema which at one point was spreading into his anterior foot. The patient does not have a known history of PAD or claudication. No wound care history. He is a nonsmoker. 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 fib on Xarelto, type 2 diabetes on oral agents, gout including involvement of the right ankle. I note a call yesterday from his wife to his primary doctor's office in [place] with fever and chills. He was seen his primary doctor's office and given a macrolide. He feels better today. He is still applying peroxide to the wound  surface. ABI in this clinic 1.16 on the right and 1.23 on the left. 03/29/17; patient came to the clinic last week with a laceration on the left Achilles area. He is a type II diabetic. He states after he was here in  the clinic last week he developed small red rash on his bilateral lower extremities, on-call doctor ordered doxycycline for him. I don't see any evidence of that currently. 04/05/17; traumatic wound on the Achilles area has totally resolved and is epithelialized. He is a type II diabetic and has chronic venous insufficiency with some degree of hemosiderin deposition and chronic edema. Objective Constitutional Sitting or standing Blood Pressure is within target range for patient.. Pulse regular and within target range for patient.Marland Kitchen Respirations regular, non-labored and within target range.. Temperature is normal and within the target range for the patient.Marland Kitchen appears in no distress. Corey [name], [name] ([id number]) Vitals Time Taken: 11:05 AM, Height: 66 in, Weight: 252 lbs, BMI: 40.7, Pulse: 78 bpm, Respiratory Rate: 16 breaths/min, Blood Pressure: 118/60 mmHg. Eyes Conjunctivae clear. No discharge. Respiratory Respiratory effort is easy and symmetric bilaterally. Rate is normal at rest and on room air.. Cardiovascular Pedal pulses present.. Minimal edema in the ankle area. Hemosiderin deposition. Psychiatric No evidence of depression, anxiety, or agitation. Calm, cooperative, and communicative. Appropriate interactions and affect.. General Notes: Wound exam the area is fully epithelialized and looks more mature than last week. I think he can be discharged. I don't believe there is going to be anything in terms of secondary prevention at this point although with his degree of venous insufficiency that may become necessary. We had some talk about compression stockings Integumentary (Hair, Skin) no rash. Wound #1 status is Open. Original cause of wound was Trauma. The wound is located  on the Right Achilles. The wound measures 0cm length x 0cm width x 0cm depth; 0cm^2 area and 0cm^3 volume. Assessment Active Problems ICD-10 V77.939 - Non-pressure chronic ulcer of right ankle limited to breakdown of skin E11.622 - Type 2 diabetes mellitus with other skin ulcer Plan Discharge From [facility] Services: Discharge from [facility] [place], [place] ([id number]) #1 and the patient can be discharged from the wound care center #2 no current secondary prevention as this was a traumatic wound. He does have significant chronic venous insufficiency with edema and hemosiderin deposition. At some point he might consider graded compression stockings. He is at risk for lower extremity wounds going forward Electronic Signature(s) Signed: 04/05/2017 4:28:59 PM By: Linton Ham MD Entered By: Linton Ham on 04/05/2017 11:19:16 [place], [place]. ([id number]) -------------------------------------------------------------------------------- SuperBill Details Patient Name: Corey [name], [name] Date of Service: 04/05/2017 Medical Record Number: [id number] Patient Account Number: [id number] Date of Birth/Sex: [birth year] (76 y.o. Male) Treating RN: Cornell Barman Primary Care Provider: Fulton Reek Other Clinician: Referring Provider: Fulton Reek Treating Provider/Extender: Ricard Dillon Weeks in Treatment: 2 Diagnosis Coding ICD-10 Codes Code Description L37.342 Non-pressure chronic ulcer of right ankle limited to breakdown of skin E11.622 Type 2 diabetes mellitus with other skin ulcer Facility Procedures CPT4 Code: [id number] Description: [id number] 0003 - WOUND CARE VISIT-LEV 2 EST PT Modifier: Quantity: 1 Physician Procedures CPT4 Code Description: [id number] - WC PHYS LEVEL 2 - EST PT ICD-10 Description Diagnosis M46.803 Non-pressure chronic ulcer of right ankle limited Modifier: to breakdown o Quantity: 1 f skin Electronic Signature(s) Signed: 04/05/2017 4:28:59 PM By: Linton Ham  MD Entered By: Linton Ham on 04/05/2017 11:19:39

## 2017-04-06 NOTE — Progress Notes (Signed)
ZEBEDEE, SEGUNDO ([id number]) Visit Report for 04/05/2017 Arrival Information Details Patient Name: Corey [name], Corey [name]. Date of Service: 04/05/2017 11:00 AM Medical Record Number: [id number] Patient Account Number: [id number] Date of Birth/Sex: 14-Dec-1940 (75 y.o. Male) Treating RN: Cornell Barman Primary Care Louis Gaw: Fulton Reek Other Clinician: Referring Ionia Schey: Fulton Reek Treating Danton Palmateer/Extender: Tito Dine in Treatment: 2 Visit Information History Since Last Visit Added or deleted any medications: No Patient Arrived: Ambulatory Any new allergies or adverse reactions: No Arrival Time: 11:01 Had a fall or experienced change in No Accompanied By: self activities of daily living that may affect Transfer Assistance: None risk of falls: Patient Identification Verified: Yes Signs or symptoms of abuse/neglect since last No Secondary Verification Process Yes visito Completed: Hospitalized since last visit: No Patient Has Alerts: Yes Has Dressing in Place as Prescribed: No Patient Alerts: Patient on Blood Pain Present Now: No Thinner xarelto Electronic Signature(s) Signed: 04/05/2017 1:35:33 PM By: Gretta Cool, BSN, RN, CWS, Kim RN, BSN Entered By: Gretta Cool, BSN, RN, CWS, Kim on 04/05/2017 11:04:51 Corey [name] ([id number]) -------------------------------------------------------------------------------- Clinic Level of Care Assessment Details Patient Name: [name], [name] Date of Service: 04/05/2017 11:00 AM Medical Record Number: [id number] Patient Account Number: [id number] Date of Birth/Sex: 23-Oct-1940 (76 y.o. Male) Treating RN: Cornell Barman Primary Care Lain Tetterton: Fulton Reek Other Clinician: Referring Shadman Tozzi: Fulton Reek Treating Valrie Jia/Extender: Tito Dine in Treatment: 2 Clinic Level of Care Assessment Items TOOL 4 Quantity Score []  - Use when only an EandM is performed on FOLLOW-UP visit 0 ASSESSMENTS - Nursing Assessment /  Reassessment []  - Reassessment of Co-morbidities (includes updates in patient status) 0 X - Reassessment of Adherence to Treatment Plan 1 5 ASSESSMENTS - Wound and Skin Assessment / Reassessment X - Simple Wound Assessment / Reassessment - one wound 1 5 []  - Complex Wound Assessment / Reassessment - multiple wounds 0 []  - Dermatologic / Skin Assessment (not related to wound area) 0 ASSESSMENTS - Focused Assessment []  - Circumferential Edema Measurements - multi extremities 0 []  - Nutritional Assessment / Counseling / Intervention 0 []  - Lower Extremity Assessment (monofilament, tuning fork, pulses) 0 []  - Peripheral Arterial Disease Assessment (using hand held doppler) 0 ASSESSMENTS - Ostomy and/or Continence Assessment and Care []  - Incontinence Assessment and Management 0 []  - Ostomy Care Assessment and Management (repouching, etc.) 0 PROCESS - Coordination of Care X - Simple Patient / Family Education for ongoing care 1 15 []  - Complex (extensive) Patient / Family Education for ongoing care 0 []  - Staff obtains Programmer, systems, Records, Test Results / Process Orders 0 []  - Staff telephones HHA, Nursing Homes / Clarify orders / etc 0 []  - Routine Transfer to another Facility (non-emergent condition) 0 [name], [name] ([id number]) []  - Routine Hospital Admission (non-emergent condition) 0 []  - New Admissions / Biomedical engineer / Ordering NPWT, Apligraf, etc. 0 []  - Emergency Hospital Admission (emergent condition) 0 X - Simple Discharge Coordination 1 10 []  - Complex (extensive) Discharge Coordination 0 PROCESS - Special Needs []  - Pediatric / Minor Patient Management 0 []  - Isolation Patient Management 0 []  - Hearing / Language / Visual special needs 0 []  - Assessment of Community assistance (transportation, D/C planning, etc.) 0 []  - Additional assistance / Altered mentation 0 []  - Support Surface(s) Assessment (bed, cushion, seat, etc.) 0 INTERVENTIONS - Wound Cleansing /  Measurement []  - Simple Wound Cleansing - one wound 0 []  - Complex Wound Cleansing - multiple wounds 0 X - Wound Imaging (photographs -  any number of wounds) 1 5 []  - Wound Tracing (instead of photographs) 0 []  - Simple Wound Measurement - one wound 0 []  - Complex Wound Measurement - multiple wounds 0 INTERVENTIONS - Wound Dressings []  - Small Wound Dressing one or multiple wounds 0 []  - Medium Wound Dressing one or multiple wounds 0 []  - Large Wound Dressing one or multiple wounds 0 []  - Application of Medications - topical 0 []  - Application of Medications - injection 0 INTERVENTIONS - Miscellaneous []  - External ear exam 0 [name], [name] ([id number]) []  - Specimen Collection (cultures, biopsies, blood, body fluids, etc.) 0 []  - Specimen(s) / Culture(s) sent or taken to Lab for analysis 0 []  - Patient Transfer (multiple staff / Harrel Lemon Lift / Similar devices) 0 []  - Simple Staple / Suture removal (25 or less) 0 []  - Complex Staple / Suture removal (26 or more) 0 []  - Hypo / Hyperglycemic Management (close monitor of Blood Glucose) 0 []  - Ankle / Brachial Index (ABI) - do not check if billed separately 0 X - Vital Signs 1 5 Has the patient been seen at the hospital within the last three years: Yes Total Score: 45 Level Of Care: New/Established - Level 2 Electronic Signature(s) Signed: 04/05/2017 1:35:33 PM By: Gretta Cool, BSN, RN, CWS, Kim RN, BSN Entered By: Gretta Cool, BSN, RN, CWS, Kim on 04/05/2017 11:09:01 Corey [name] ([id number]) -------------------------------------------------------------------------------- Encounter Discharge Information Details Patient Name: [name], [name] Date of Service: 04/05/2017 11:00 AM Medical Record Number: [id number] Patient Account Number: [id number] Date of Birth/Sex: [birth year] (76 y.o. Male) Treating RN: Cornell Barman Primary Care Trevante Tennell: Fulton Reek Other Clinician: Referring Makaylia Hewett: Fulton Reek Treating Alayla Dethlefs/Extender: Tito Dine in Treatment: 2 Encounter Discharge Information Items Discharge Pain Level: 0 Discharge Condition: Stable Ambulatory Status: Ambulatory Discharge Destination: Home Private Transportation: Auto Accompanied By: self Schedule Follow-up Appointment: Yes Medication Reconciliation completed and Yes provided to Patient/Care Buena Boehm: Clinical Summary of Care: Electronic Signature(s) Signed: 04/05/2017 1:35:33 PM By: Gretta Cool, BSN, RN, CWS, Kim RN, BSN Entered By: Gretta Cool, BSN, RN, CWS, Kim on 04/05/2017 11:11:33 Moodus, Corey [name] ([id number]) -------------------------------------------------------------------------------- Lower Extremity Assessment Details Patient Name: [name], [name] Date of Service: 04/05/2017 11:00 AM Medical Record Number: [id number] Patient Account Number: [id number] Date of Birth/Sex: Jan 17, 1941 (76 y.o. Male) Treating RN: Cornell Barman Primary Care Thelda Gagan: Fulton Reek Other Clinician: Referring Londa Mackowski: Fulton Reek Treating Efrata Brunner/Extender: Ricard Dillon Weeks in Treatment: 2 Vascular Assessment Pulses: Dorsalis Pedis Palpable: [Right:Yes] Posterior Tibial Extremity colors, hair growth, and conditions: Extremity Color: [Right:Normal] Hair Growth on Extremity: [Right:Yes] Temperature of Extremity: [Right:Warm] Capillary Refill: [Right:< 3 seconds] Electronic Signature(s) Signed: 04/05/2017 1:35:33 PM By: Gretta Cool, BSN, RN, CWS, Kim RN, BSN Entered By: Gretta Cool, BSN, RN, CWS, Kim on 04/05/2017 11:07:46 [place], [place] L. ([id number]) -------------------------------------------------------------------------------- Multi Wound Chart Details Patient Name: [name], [name] Date of Service: 04/05/2017 11:00 AM Medical Record Number: [id number] Patient Account Number: [id number] Date of Birth/Sex: 08/06/1940 (76 y.o. Male) Treating RN: Cornell Barman Primary Care Cozette Braggs: Fulton Reek Other Clinician: Referring Merideth Bosque: Fulton Reek Treating  Lucienne Sawyers/Extender: Ricard Dillon Weeks in Treatment: 2 Vital Signs Height(in): 66 Pulse(bpm): 78 Weight(lbs): 252 Blood Pressure 118/60 (mmHg): Body Mass Index(BMI): 41 Temperature(F): Respiratory Rate 16 (breaths/min): Photos: [N/A:N/A] Wound Location: Right Achilles N/A N/A Wounding Event: Trauma N/A N/A Primary Etiology: Diabetic Wound/Ulcer of N/A N/A the Lower Extremity Secondary Etiology: Trauma, Other N/A N/A Date Acquired: 02/20/2017 N/A N/A Weeks of Treatment: 2 N/A N/A Wound Status: Open N/A N/A  Measurements L x W x D 0x0x0 N/A N/A (cm) Area (cm) : 0 N/A N/A Volume (cm) : 0 N/A N/A % Reduction in Area: 100.00% N/A N/A % Reduction in Volume: 100.00% N/A N/A Classification: Grade 1 N/A N/A Periwound Skin Texture: No Abnormalities Noted N/A N/A Periwound Skin No Abnormalities Noted N/A N/A Moisture: Periwound Skin Color: No Abnormalities Noted N/A N/A Tenderness on No N/A N/A Palpation: Treatment Notes Corey [name], Corey [name] ([id number]) Electronic Signature(s) Signed: 04/05/2017 4:28:59 PM By: Linton Ham MD Entered By: Linton Ham on 04/05/2017 11:13:23 [place], [place]on Carlean [name] ([id number]) -------------------------------------------------------------------------------- [place] Details Patient Name: Ladean [name], [name] Date of Service: 04/05/2017 11:00 AM Medical Record Number: [id number] Patient Account Number: [id number] Date of Birth/Sex: 09-18-1940 (76 y.o. Male) Treating RN: Cornell Barman Primary Care Aldahir Litaker: Fulton Reek Other Clinician: Referring Tichina Koebel: Fulton Reek Treating Eulis Salazar/Extender: Tito Dine in Treatment: 2 Active Inactive Electronic Signature(s) Signed: 04/05/2017 1:35:33 PM By: Gretta Cool, BSN, RN, CWS, Kim RN, BSN Entered By: Gretta Cool, BSN, RN, CWS, Kim on 04/05/2017 11:08:05 [name], Corey [name] ([id number]) -------------------------------------------------------------------------------- Pain Assessment  Details Patient Name: [name], [name] Date of Service: 04/05/2017 11:00 AM Medical Record Number: [id number] Patient Account Number: [id number] Date of Birth/Sex: Oct 23, 1940 (76 y.o. Male) Treating RN: Cornell Barman Primary Care Kaeo Jacome: Fulton Reek Other Clinician: Referring Marlet Korte: Fulton Reek Treating Jaice Lague/Extender: Tito Dine in Treatment: 2 Active Problems Location of Pain Severity and Description of Pain Patient Has Paino No Site Locations With Dressing Change: No Pain Management and Medication Current Pain Management: Electronic Signature(s) Signed: 04/05/2017 1:35:33 PM By: Gretta Cool, BSN, RN, CWS, Kim RN, BSN Entered By: Gretta Cool, BSN, RN, CWS, Kim on 04/05/2017 11:04:58 Stejskal, Corey [name] ([id number]) -------------------------------------------------------------------------------- Patient/Caregiver Education Details Patient Name: Ladean [name], [name] Date of Service: 04/05/2017 11:00 AM Medical Record Number: [id number] Patient Account Number: [id number] Date of Birth/Gender: [birth year] (76 y.o. Male) Treating RN: Cornell Barman Primary Care Physician: Fulton Reek Other Clinician: Referring Physician: Fulton Reek Treating Physician/Extender: Tito Dine in Treatment: 2 Education Assessment Education Provided To: Patient Education Topics Provided Wound/Skin Impairment: Handouts: Other: healed Engineer, maintenance) Signed: 04/05/2017 1:35:33 PM By: Gretta Cool, BSN, RN, CWS, Kim RN, BSN Entered By: Gretta Cool, BSN, RN, CWS, Kim on 04/05/2017 11:11:50 Darin, Corey [name] ([id number]) -------------------------------------------------------------------------------- Wound Assessment Details Patient Name: [name], [name] Date of Service: 04/05/2017 11:00 AM Medical Record Number: [id number] Patient Account Number: [id number] Date of Birth/Sex: 1940-09-07 (76 y.o. 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 Secondary Trauma, Other Wounding Event: Trauma Etiology: Date Acquired: 02/20/2017 Wound Status: Open Weeks Of Treatment: 2 Clustered Wound: No Photos Photo Uploaded By: Gretta Cool, BSN, RN, CWS, Kim on 04/05/2017 11:12:21 Wound Measurements Length: (cm) 0 Width: (cm) 0 Depth: (cm) 0 Area: (cm) 0 Volume: (cm) 0 % Reduction in Area: 100% % Reduction in Volume: 100% Wound Description Classification: Grade 1 Periwound Skin Texture Texture Color No Abnormalities Noted: No No Abnormalities Noted: No Moisture No Abnormalities Noted: No Electronic Signature(s) Signed: 04/05/2017 1:35:33 PM By: Gretta Cool, BSN, RN, CWS, Kim RN, BSN Entered By: Gretta Cool, BSN, RN, CWS, Kim on 04/05/2017 11:06:37 [place], [place] Carlean [name] ([id number]) -------------------------------------------------------------------------------- Vitals Details Patient Name: Orvis, [name] Date of Service: 04/05/2017 11:00 AM Medical Record Number: [id number] Patient Account Number: [id number] Date of Birth/Sex: 1941-03-11 (76 y.o. Male)  Treating RN: Cornell Barman Primary Care Anjana Cheek: Fulton Reek Other Clinician: Referring Babette Stum: Fulton Reek Treating Breeanne Oblinger/Extender: Tito Dine in Treatment: 2 Vital Signs Time Taken: 11:05 Pulse (bpm): 78 Height (in): 66 Respiratory Rate (breaths/min): 16 Weight (lbs): 252 Blood Pressure (mmHg): 118/60 Body Mass Index (BMI): 40.7 Reference Range: 80 - 120 mg / dl Electronic Signature(s) Signed: 04/05/2017 1:35:33 PM By: Gretta Cool, BSN, RN, CWS, Kim RN, BSN Entered By: Gretta Cool, BSN, RN, CWS, Kim on 04/05/2017 11:05:18

## 2017-05-16 DIAGNOSIS — M1A00X0 Idiopathic chronic gout, unspecified site, without tophus (tophi): Secondary | ICD-10-CM | POA: Diagnosis not present

## 2017-05-16 DIAGNOSIS — E785 Hyperlipidemia, unspecified: Secondary | ICD-10-CM | POA: Diagnosis not present

## 2017-05-16 DIAGNOSIS — E119 Type 2 diabetes mellitus without complications: Secondary | ICD-10-CM | POA: Diagnosis not present

## 2017-05-16 DIAGNOSIS — Z79899 Other long term (current) drug therapy: Secondary | ICD-10-CM | POA: Diagnosis not present

## 2017-05-22 DIAGNOSIS — Z79899 Other long term (current) drug therapy: Secondary | ICD-10-CM | POA: Diagnosis not present

## 2017-05-22 DIAGNOSIS — G4733 Obstructive sleep apnea (adult) (pediatric): Secondary | ICD-10-CM | POA: Diagnosis not present

## 2017-05-22 DIAGNOSIS — E119 Type 2 diabetes mellitus without complications: Secondary | ICD-10-CM | POA: Diagnosis not present

## 2017-05-22 DIAGNOSIS — Z6841 Body Mass Index (BMI) 40.0 and over, adult: Secondary | ICD-10-CM | POA: Diagnosis not present

## 2017-05-22 DIAGNOSIS — Z125 Encounter for screening for malignant neoplasm of prostate: Secondary | ICD-10-CM | POA: Diagnosis not present

## 2017-05-22 DIAGNOSIS — I480 Paroxysmal atrial fibrillation: Secondary | ICD-10-CM | POA: Diagnosis not present

## 2017-05-22 DIAGNOSIS — E785 Hyperlipidemia, unspecified: Secondary | ICD-10-CM | POA: Diagnosis not present

## 2017-06-27 DIAGNOSIS — G4733 Obstructive sleep apnea (adult) (pediatric): Secondary | ICD-10-CM | POA: Diagnosis not present

## 2017-06-27 DIAGNOSIS — R0602 Shortness of breath: Secondary | ICD-10-CM | POA: Diagnosis not present

## 2017-06-27 DIAGNOSIS — I4891 Unspecified atrial fibrillation: Secondary | ICD-10-CM | POA: Diagnosis not present

## 2017-06-27 DIAGNOSIS — E785 Hyperlipidemia, unspecified: Secondary | ICD-10-CM | POA: Diagnosis not present

## 2017-06-27 DIAGNOSIS — K219 Gastro-esophageal reflux disease without esophagitis: Secondary | ICD-10-CM | POA: Diagnosis not present

## 2017-06-27 DIAGNOSIS — R001 Bradycardia, unspecified: Secondary | ICD-10-CM | POA: Diagnosis not present

## 2017-06-27 DIAGNOSIS — M1990 Unspecified osteoarthritis, unspecified site: Secondary | ICD-10-CM | POA: Diagnosis not present

## 2017-10-09 DIAGNOSIS — Z86010 Personal history of colonic polyps: Secondary | ICD-10-CM | POA: Diagnosis not present

## 2017-10-09 DIAGNOSIS — I480 Paroxysmal atrial fibrillation: Secondary | ICD-10-CM | POA: Diagnosis not present

## 2017-10-09 DIAGNOSIS — Z8719 Personal history of other diseases of the digestive system: Secondary | ICD-10-CM | POA: Diagnosis not present

## 2017-10-09 DIAGNOSIS — E119 Type 2 diabetes mellitus without complications: Secondary | ICD-10-CM | POA: Diagnosis not present

## 2017-10-09 DIAGNOSIS — K219 Gastro-esophageal reflux disease without esophagitis: Secondary | ICD-10-CM | POA: Diagnosis not present

## 2017-10-09 DIAGNOSIS — R1310 Dysphagia, unspecified: Secondary | ICD-10-CM | POA: Diagnosis not present

## 2017-11-16 DIAGNOSIS — E785 Hyperlipidemia, unspecified: Secondary | ICD-10-CM | POA: Diagnosis not present

## 2017-11-16 DIAGNOSIS — Z79899 Other long term (current) drug therapy: Secondary | ICD-10-CM | POA: Diagnosis not present

## 2017-11-16 DIAGNOSIS — Z125 Encounter for screening for malignant neoplasm of prostate: Secondary | ICD-10-CM | POA: Diagnosis not present

## 2017-11-16 DIAGNOSIS — E119 Type 2 diabetes mellitus without complications: Secondary | ICD-10-CM | POA: Diagnosis not present

## 2017-11-17 DIAGNOSIS — D2271 Melanocytic nevi of right lower limb, including hip: Secondary | ICD-10-CM | POA: Diagnosis not present

## 2017-11-17 DIAGNOSIS — D2272 Melanocytic nevi of left lower limb, including hip: Secondary | ICD-10-CM | POA: Diagnosis not present

## 2017-11-17 DIAGNOSIS — D2261 Melanocytic nevi of right upper limb, including shoulder: Secondary | ICD-10-CM | POA: Diagnosis not present

## 2017-11-17 DIAGNOSIS — D2262 Melanocytic nevi of left upper limb, including shoulder: Secondary | ICD-10-CM | POA: Diagnosis not present

## 2017-11-17 DIAGNOSIS — Z08 Encounter for follow-up examination after completed treatment for malignant neoplasm: Secondary | ICD-10-CM | POA: Diagnosis not present

## 2017-11-17 DIAGNOSIS — D225 Melanocytic nevi of trunk: Secondary | ICD-10-CM | POA: Diagnosis not present

## 2017-11-17 DIAGNOSIS — Z85828 Personal history of other malignant neoplasm of skin: Secondary | ICD-10-CM | POA: Diagnosis not present

## 2017-11-17 DIAGNOSIS — L821 Other seborrheic keratosis: Secondary | ICD-10-CM | POA: Diagnosis not present

## 2017-11-17 DIAGNOSIS — D485 Neoplasm of uncertain behavior of skin: Secondary | ICD-10-CM | POA: Diagnosis not present

## 2017-11-23 DIAGNOSIS — E119 Type 2 diabetes mellitus without complications: Secondary | ICD-10-CM | POA: Diagnosis not present

## 2017-11-23 DIAGNOSIS — Z0000 Encounter for general adult medical examination without abnormal findings: Secondary | ICD-10-CM | POA: Diagnosis not present

## 2017-11-23 DIAGNOSIS — Z79899 Other long term (current) drug therapy: Secondary | ICD-10-CM | POA: Diagnosis not present

## 2017-11-23 DIAGNOSIS — I480 Paroxysmal atrial fibrillation: Secondary | ICD-10-CM | POA: Diagnosis not present

## 2017-11-23 DIAGNOSIS — E785 Hyperlipidemia, unspecified: Secondary | ICD-10-CM | POA: Diagnosis not present

## 2017-12-22 ENCOUNTER — Encounter: Payer: Self-pay | Admitting: *Deleted

## 2017-12-25 ENCOUNTER — Ambulatory Visit
Admission: RE | Admit: 2017-12-25 | Discharge: 2017-12-25 | Disposition: A | Discharge status: Home / Self Care | Payer: PPO | Source: Ambulatory Visit | Attending: Unknown Physician Specialty | Admitting: Unknown Physician Specialty

## 2017-12-25 ENCOUNTER — Encounter
Admission: RE | Disposition: A | Discharge status: Home / Self Care | Payer: Self-pay | Source: Ambulatory Visit | Attending: Unknown Physician Specialty

## 2017-12-25 ENCOUNTER — Ambulatory Visit: Payer: PPO | Admitting: Anesthesiology

## 2017-12-25 ENCOUNTER — Encounter: Payer: Self-pay | Admitting: *Deleted

## 2017-12-25 DIAGNOSIS — K219 Gastro-esophageal reflux disease without esophagitis: Secondary | ICD-10-CM | POA: Diagnosis not present

## 2017-12-25 DIAGNOSIS — E785 Hyperlipidemia, unspecified: Secondary | ICD-10-CM | POA: Diagnosis not present

## 2017-12-25 DIAGNOSIS — E119 Type 2 diabetes mellitus without complications: Secondary | ICD-10-CM | POA: Insufficient documentation

## 2017-12-25 DIAGNOSIS — N400 Enlarged prostate without lower urinary tract symptoms: Secondary | ICD-10-CM | POA: Diagnosis not present

## 2017-12-25 DIAGNOSIS — K640 First degree hemorrhoids: Secondary | ICD-10-CM | POA: Insufficient documentation

## 2017-12-25 DIAGNOSIS — Z1211 Encounter for screening for malignant neoplasm of colon: Secondary | ICD-10-CM | POA: Diagnosis not present

## 2017-12-25 DIAGNOSIS — D124 Benign neoplasm of descending colon: Secondary | ICD-10-CM | POA: Diagnosis not present

## 2017-12-25 DIAGNOSIS — Z7902 Long term (current) use of antithrombotics/antiplatelets: Secondary | ICD-10-CM | POA: Diagnosis not present

## 2017-12-25 DIAGNOSIS — K222 Esophageal obstruction: Secondary | ICD-10-CM | POA: Diagnosis not present

## 2017-12-25 DIAGNOSIS — K635 Polyp of colon: Secondary | ICD-10-CM | POA: Diagnosis not present

## 2017-12-25 DIAGNOSIS — Z79899 Other long term (current) drug therapy: Secondary | ICD-10-CM | POA: Insufficient documentation

## 2017-12-25 DIAGNOSIS — I4891 Unspecified atrial fibrillation: Secondary | ICD-10-CM | POA: Insufficient documentation

## 2017-12-25 DIAGNOSIS — N529 Male erectile dysfunction, unspecified: Secondary | ICD-10-CM | POA: Diagnosis not present

## 2017-12-25 DIAGNOSIS — K449 Diaphragmatic hernia without obstruction or gangrene: Secondary | ICD-10-CM | POA: Insufficient documentation

## 2017-12-25 DIAGNOSIS — K5730 Diverticulosis of large intestine without perforation or abscess without bleeding: Secondary | ICD-10-CM | POA: Insufficient documentation

## 2017-12-25 DIAGNOSIS — Z86010 Personal history of colonic polyps: Secondary | ICD-10-CM | POA: Insufficient documentation

## 2017-12-25 DIAGNOSIS — R1310 Dysphagia, unspecified: Secondary | ICD-10-CM | POA: Diagnosis not present

## 2017-12-25 HISTORY — DX: Unspecified glaucoma: H40.9

## 2017-12-25 HISTORY — PX: ESOPHAGOGASTRODUODENOSCOPY (EGD) WITH PROPOFOL: SHX5813

## 2017-12-25 HISTORY — DX: Benign prostatic hyperplasia without lower urinary tract symptoms: N40.0

## 2017-12-25 HISTORY — DX: Personal history of colonic polyps: Z86.010

## 2017-12-25 HISTORY — DX: Gastro-esophageal reflux disease without esophagitis: K21.9

## 2017-12-25 HISTORY — DX: Personal history of other infectious and parasitic diseases: Z86.19

## 2017-12-25 HISTORY — DX: Malignant (primary) neoplasm, unspecified: C80.1

## 2017-12-25 HISTORY — DX: Type 2 diabetes mellitus without complications: E11.9

## 2017-12-25 HISTORY — DX: Gastric ulcer, unspecified as acute or chronic, without hemorrhage or perforation: K25.9

## 2017-12-25 HISTORY — DX: Personal history of colon polyps, unspecified: Z86.0100

## 2017-12-25 HISTORY — DX: Personal history of urinary calculi: Z87.442

## 2017-12-25 HISTORY — DX: Sleep apnea, unspecified: G47.30

## 2017-12-25 HISTORY — DX: Dysphagia, unspecified: R13.10

## 2017-12-25 HISTORY — DX: Other allergy status, other than to drugs and biological substances: Z91.09

## 2017-12-25 HISTORY — DX: Cardiac arrhythmia, unspecified: I49.9

## 2017-12-25 HISTORY — DX: Male erectile dysfunction, unspecified: N52.9

## 2017-12-25 HISTORY — DX: Obesity, unspecified: E66.9

## 2017-12-25 HISTORY — DX: Hyperlipidemia, unspecified: E78.5

## 2017-12-25 HISTORY — PX: COLONOSCOPY WITH PROPOFOL: SHX5780

## 2017-12-25 HISTORY — DX: Unspecified osteoarthritis, unspecified site: M19.90

## 2017-12-25 LAB — GLUCOSE, CAPILLARY: Glucose-Capillary: 101 mg/dL — ABNORMAL HIGH (ref 65–99)

## 2017-12-25 SURGERY — ESOPHAGOGASTRODUODENOSCOPY (EGD) WITH PROPOFOL
Anesthesia: General

## 2017-12-25 MED ORDER — SODIUM CHLORIDE 0.9 % IV SOLN: INTRAVENOUS | Status: DC

## 2017-12-25 MED ORDER — GLYCOPYRROLATE 0.2 MG/ML IJ SOLN
INTRAMUSCULAR | Status: DC | PRN
  Administered 2017-12-25: 0.2 mg via INTRAVENOUS

## 2017-12-25 MED ORDER — LIDOCAINE HCL (PF) 2 % IJ SOLN
INTRAMUSCULAR | Status: DC | PRN
  Administered 2017-12-25: 100 mg

## 2017-12-25 MED ORDER — EPHEDRINE SULFATE 50 MG/ML IJ SOLN
INTRAMUSCULAR | Status: AC
  Filled 2017-12-25: qty 1

## 2017-12-25 MED ORDER — PROPOFOL 500 MG/50ML IV EMUL
INTRAVENOUS | Status: AC
  Filled 2017-12-25: qty 50

## 2017-12-25 MED ORDER — GLYCOPYRROLATE 0.2 MG/ML IJ SOLN
INTRAMUSCULAR | Status: AC
  Filled 2017-12-25: qty 1

## 2017-12-25 MED ORDER — PHENYLEPHRINE HCL 10 MG/ML IJ SOLN
INTRAMUSCULAR | Status: AC
  Filled 2017-12-25: qty 1

## 2017-12-25 MED ORDER — PROPOFOL 10 MG/ML IV BOLUS
INTRAVENOUS | Status: DC | PRN
  Administered 2017-12-25: 20 mg via INTRAVENOUS
  Administered 2017-12-25: 10 mg via INTRAVENOUS

## 2017-12-25 MED ORDER — MIDAZOLAM HCL 5 MG/5ML IJ SOLN
INTRAMUSCULAR | Status: DC | PRN
  Administered 2017-12-25: 2 mg via INTRAVENOUS

## 2017-12-25 MED ORDER — PROPOFOL 500 MG/50ML IV EMUL
INTRAVENOUS | Status: DC | PRN
  Administered 2017-12-25: 50 ug/kg/min via INTRAVENOUS

## 2017-12-25 MED ORDER — LIDOCAINE HCL (PF) 2 % IJ SOLN
INTRAMUSCULAR | Status: AC
  Filled 2017-12-25: qty 10

## 2017-12-25 MED ORDER — FENTANYL CITRATE (PF) 100 MCG/2ML IJ SOLN
INTRAMUSCULAR | Status: AC
  Filled 2017-12-25: qty 2

## 2017-12-25 MED ORDER — FENTANYL CITRATE (PF) 100 MCG/2ML IJ SOLN
INTRAMUSCULAR | Status: DC | PRN
  Administered 2017-12-25: 25 ug via INTRAVENOUS

## 2017-12-25 MED ORDER — MIDAZOLAM HCL 2 MG/2ML IJ SOLN
INTRAMUSCULAR | Status: AC
  Filled 2017-12-25: qty 2

## 2017-12-25 MED ORDER — SODIUM CHLORIDE 0.9 % IV SOLN
INTRAVENOUS | Status: DC
  Administered 2017-12-25: 07:00:00 via INTRAVENOUS

## 2017-12-25 NOTE — Op Note (Addendum)
[facility] Gastroenterology Patient Name: Corey [name] Procedure Date: 12/25/2017 7:35 AM MRN: [id number] Account #: [id number] Date of Birth: 04/26/1941 Admit Type: Outpatient Age: 77 Room: [facility] ENDO ROOM 3 Gender: Male Note Status: Finalized Procedure:            Upper GI endoscopy Indications:          Dysphagia Providers:            Manya Silvas, MD Medicines:            Propofol per Anesthesia Complications:        No immediate complications. Procedure:            Pre-Anesthesia Assessment:                       - After reviewing the risks and benefits, the patient                        was deemed in satisfactory condition to undergo the                        procedure.                       After obtaining informed consent, the endoscope was                        passed under direct vision. Throughout the procedure,                        the patient's blood pressure, pulse, and oxygen                        saturations were monitored continuously.The upper GI                        endoscopy was accomplished without difficulty. The                        patient tolerated the procedure fairly well. The                        Endoscope was introduced through the and advanced to                        the. Findings:      A mild Schatzki ring was found at the gastroesophageal junction. 40cm       from teeth.After exam was completed A guidewire was placed and the scope       was withdrawn. Dilation was performed with a Savary dilator with no       resistance at 17 mm. Small hiatal hernia seen also.      The stomach was normal.      The examined duodenum was normal. Impression:           - Mild Schatzki ring. Dilated.                       - Normal stomach.                       - Normal examined duodenum.                       -  No specimens collected. Recommendation:       - Perform a colonoscopy as previously scheduled. Manya Silvas,  MD 12/25/2017 7:46:53 AM This report has been signed electronically. Number of Addenda: 0 Note Initiated On: 12/25/2017 7:35 AM      [facility]

## 2017-12-25 NOTE — Anesthesia Postprocedure Evaluation (Signed)
Anesthesia Post Note  Patient: Corey [name]  Procedure(s) Performed: ESOPHAGOGASTRODUODENOSCOPY (EGD) WITH PROPOFOL (N/A ) COLONOSCOPY WITH PROPOFOL (N/A )  Patient location during evaluation: Endoscopy Anesthesia Type: General Level of consciousness: awake and alert Pain management: pain level controlled Vital Signs Assessment: post-procedure vital signs reviewed and stable Respiratory status: spontaneous breathing, nonlabored ventilation, respiratory function stable and patient connected to nasal cannula oxygen Cardiovascular status: blood pressure returned to baseline and stable Postop Assessment: no apparent nausea or vomiting Anesthetic complications: no     Last Vitals:  Vitals:   12/25/17 0842 12/25/17 0852  BP: 101/65 (!) 92/56  Pulse: 72 62  Resp:    Temp:    SpO2: 97% 97%    Last Pain:  Vitals:   12/25/17 0852  TempSrc:   PainSc: 0-No pain                 Martha Clan

## 2017-12-25 NOTE — Anesthesia Preprocedure Evaluation (Addendum)
Anesthesia Evaluation  Patient identified by MRN, date of birth, ID band Patient awake    Reviewed: Allergy & Precautions, H&P , NPO status , Patient's Chart, lab work & pertinent test results, reviewed documented beta blocker date and time   History of Anesthesia Complications Negative for: history of anesthetic complications  Airway Mallampati: I  TM Distance: >3 FB Neck ROM: full    Dental  (+) Dental Advidsory Given, Missing, Teeth Intact   Pulmonary neg shortness of breath, sleep apnea , neg COPD, neg recent URI,           Cardiovascular Exercise Tolerance: Good (-) hypertension(-) angina(-) CAD, (-) Past MI, (-) Cardiac Stents and (-) CABG + dysrhythmias Atrial Fibrillation (-) Valvular Problems/Murmurs     Neuro/Psych negative neurological ROS  negative psych ROS   GI/Hepatic Neg liver ROS, PUD, GERD  ,  Endo/Other  diabetes  Renal/GU Renal disease (kidney stones)  negative genitourinary   Musculoskeletal   Abdominal   Peds  Hematology negative hematology ROS (+)   Anesthesia Other Findings Past Medical History: No date: Arthritis     Comment:  OSTEOARTHRITIS No date: BPH (benign prostatic hyperplasia) No date: 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 No date: Diabetes mellitus without complication (HCC) No date: Dysphagia No date: Dysrhythmia     Comment:  A fib No date: ED (erectile dysfunction) No date: Environmental allergies No date: GERD (gastroesophageal reflux disease) No date: Glaucoma (increased eye pressure) No date: History of chicken pox No date: History of colonic polyps No date: History of kidney stones No date: Hyperlipidemia No date: Multiple gastric ulcers No date: Obesity No date: Sleep apnea   Reproductive/Obstetrics negative OB ROS                            Anesthesia Physical Anesthesia Plan  ASA: III  Anesthesia Plan: General    Post-op Pain Management:    Induction: Intravenous  PONV Risk Score and Plan: 2 and Propofol infusion  Airway Management Planned: Nasal Cannula  Additional Equipment:   Intra-op Plan:   Post-operative Plan:   Informed Consent: I have reviewed the patients History and Physical, chart, labs and discussed the procedure including the risks, benefits and alternatives for the proposed anesthesia with the patient or authorized representative who has indicated his/her understanding and acceptance.   Dental Advisory Given  Plan Discussed with: Anesthesiologist, CRNA and Surgeon  Anesthesia Plan Comments:        Anesthesia Quick Evaluation

## 2017-12-25 NOTE — H&P (Signed)
Primary Care Physician:  Idelle Crouch, MD Primary Gastroenterologist:  Dr. Vira Agar  Pre-Procedure History & Physical: HPI:  Corey [name] is a 77 y.o. male is here for an endoscopy and colonoscopy.Done for dysphagia and personal history of colon polyps. Past Medical History:  Diagnosis Date  . Arthritis    OSTEOARTHRITIS  . BPH (benign prostatic hyperplasia)   . 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 Diabetes mellitus without complication ([place])   . Dysphagia   . Dysrhythmia    A fib  . ED (erectile dysfunction)   . Environmental allergies   . GERD (gastroesophageal reflux disease)   . Glaucoma (increased eye pressure)   . History of chicken pox   . History of colonic polyps   . History of kidney stones   . Hyperlipidemia   . Multiple gastric ulcers   . Obesity   . Sleep apnea     Past Surgical History:  Procedure Laterality Date  . COLONOSCOPY    . ESOPHAGOGASTRODUODENOSCOPY    . EXCISION OF CANCER FROM BACK    . HERNIA REPAIR      Prior to Admission medications   Medication Sig Start Date End Date Taking? Authorizing Provider  acetaminophen (TYLENOL) 650 MG CR tablet Take 650 mg by mouth every 8 (eight) hours as needed for pain.   Yes [provider]  allopurinol (ZYLOPRIM) 100 MG tablet Take 100 mg by mouth daily.   Yes [provider]  cetirizine (ZYRTEC) 10 MG tablet Take 10 mg by mouth daily.   Yes [provider]  glimepiride (AMARYL) 2 MG tablet Take 2 mg by mouth daily with breakfast.   Yes [provider]  hydrochlorothiazide (HYDRODIURIL) 25 MG tablet Take 25 mg by mouth daily.   Yes [provider]  omeprazole (PRILOSEC) 20 MG capsule Take 20 mg by mouth daily.   Yes [provider]  rivaroxaban (XARELTO) 20 MG TABS tablet Take 20 mg by mouth daily with supper.   Yes [provider]  sildenafil (REVATIO) 20 MG tablet Take 20 mg by mouth 3 (three) times daily.   Yes [provider]    Allergies as of 11/02/2017  . (Not on File)    History reviewed. No pertinent family history.  Social History   Socioeconomic History  . Marital status: Married    Spouse name: Not on file  . Number of children: Not on file  . Years of education: Not on file  . Highest education level: Not on file  Occupational History  . Not on file  Social Needs  . Financial resource strain: Not on file  . Food insecurity:    Worry: Not on file    Inability: Not on file  . Transportation needs:    Medical: Not on file    Non-medical: Not on file  Tobacco Use  . Smoking status: Never Smoker  . Smokeless tobacco: Never Used  Substance and Sexual Activity  . Alcohol use: Yes  . Drug use: Never  . Sexual activity: Not on file  Lifestyle  . Physical activity:    Days per week: Not on file    Minutes per session: Not on file  . Stress: Not on file  Relationships  . Social connections:    Talks on phone: Not on file    Gets together: Not on file    Attends religious service: Not on file    Active member of  club or organization: Not on file    Attends meetings of clubs or organizations: Not on file    Relationship status: Not on file  . Intimate partner violence:    Fear of current or ex partner: Not on file    Emotionally abused: Not on file    Physically abused: Not on file    Forced sexual activity: Not on file  Other Topics Concern  . Not on file  Social History Narrative  . Not on file    Review of Systems: See HPI, otherwise negative ROS  Physical Exam: BP 102/67   Pulse (!) 34 Comment: then quickly went to 66, pt. has A. Fib  Temp (!) 96.7 F (35.9 C) (Tympanic)   Resp 16   Ht 5\' 9"  (1.753 m)   Wt 102.1 kg (225 lb)   SpO2 98%   BMI 33.23 kg/m  General:   Alert,  pleasant and cooperative in NAD Head:  Normocephalic and atraumatic. Neck:  Supple; no masses or thyromegaly. Lungs:  Clear throughout to auscultation.    Heart:  Regular rate and  rhythm. Abdomen:  Soft, nontender and nondistended. Normal bowel sounds, without guarding, and without rebound.   Neurologic:  Alert and  oriented x4;  grossly normal neurologically.  Impression/Plan: Jamesetta So is here for an endoscopy and colonoscopy to be performed for Dysphagia and PH colon polyps.  Risks, benefits, limitations, and alternatives regarding  endoscopy and colonoscopy have been reviewed with the patient.  Questions have been answered.  All parties agreeable.   Gaylyn Cheers, MD  12/25/2017, 7:27 AM

## 2017-12-25 NOTE — Anesthesia Post-op Follow-up Note (Signed)
Anesthesia QCDR form completed.        

## 2017-12-25 NOTE — Op Note (Addendum)
[facility] Gastroenterology Patient Name: Corey [name] Procedure Date: 12/25/2017 7:30 AM MRN: [id number] Account #: [id number] Date of Birth: 1941-04-02 Admit Type: Outpatient Age: 77 Room: [facility] ENDO ROOM 3 Gender: Male Note Status: Finalized Procedure:            Colonoscopy Indications:          Screening for colorectal malignant neoplasm Providers:            Manya Silvas, MD Referring MD:         Leonie Douglas. Doy Hutching, MD (Referring MD) Medicines:            Propofol per Anesthesia Complications:        No immediate complications. Procedure:            Pre-Anesthesia Assessment:                       - After reviewing the risks and benefits, the patient                        was deemed in satisfactory condition to undergo the                        procedure.                       After obtaining informed consent, the colonoscope was                        passed under direct vision. Throughout the procedure,                        the patient's blood pressure, pulse, and oxygen                        saturations were monitored continuously.The colonoscopy                        was performed without difficulty. The patient tolerated                        the procedure well. The quality of the bowel                        preparation was good. The Colonoscope was introduced                        through the and advanced to the. Findings:      A diminutive polyp was found in the transverse colon. The polyp was       sessile. The polyp was removed with a jumbo cold forceps. Resection and       retrieval were complete.      A small polyp was found in the descending colon. The polyp was sessile.       The polyp was removed with a hot snare. Resection and retrieval were       complete.      A few small-mouthed diverticula were found in the sigmoid colon,       descending colon and transverse colon.      Internal hemorrhoids were found during endoscopy. The  hemorrhoids were       small and Grade I (  internal hemorrhoids that do not prolapse).      The exam was otherwise without abnormality. Impression:           - One diminutive polyp in the transverse colon, removed                        with a jumbo cold forceps. Resected and retrieved.                       - One small polyp in the descending colon, removed with                        a hot snare. Resected and retrieved.                       - Diverticulosis in the sigmoid colon, in the                        descending colon and in the transverse colon.                       - Internal hemorrhoids.                       - The examination was otherwise normal. Recommendation:       - Await pathology results. Manya Silvas, MD 12/25/2017 8:11:12 AM This report has been signed electronically. Number of Addenda: 0 Note Initiated On: 12/25/2017 7:30 AM Scope Withdrawal Time: 0 hours 12 minutes 19 seconds  Total Procedure Duration: 0 hours 17 minutes 10 seconds       [facility]

## 2017-12-25 NOTE — Transfer of Care (Signed)
Immediate Anesthesia Transfer of Care Note  Patient: Corey [name]  Procedure(s) Performed: ESOPHAGOGASTRODUODENOSCOPY (EGD) WITH PROPOFOL (N/A ) COLONOSCOPY WITH PROPOFOL (N/A )  Patient Location: PACU  Anesthesia Type:General  Level of Consciousness: sedated  Airway & Oxygen Therapy: Patient Spontanous Breathing and Patient connected to nasal cannula oxygen  Post-op Assessment: Report given to RN and Post -op Vital signs reviewed and stable  Post vital signs: Reviewed  Last Vitals:  Vitals Value Taken Time  BP 107/61 12/25/2017  8:11 AM  Temp    Pulse 79 12/25/2017  8:11 AM  Resp 18 12/25/2017  8:11 AM  SpO2 96 % 12/25/2017  8:11 AM  Vitals shown include unvalidated device data.  Last Pain:  Vitals:   12/25/17 0656  TempSrc: Tympanic         Complications: No apparent anesthesia complications

## 2017-12-26 DIAGNOSIS — K219 Gastro-esophageal reflux disease without esophagitis: Secondary | ICD-10-CM | POA: Diagnosis not present

## 2017-12-26 DIAGNOSIS — R0602 Shortness of breath: Secondary | ICD-10-CM | POA: Diagnosis not present

## 2017-12-26 DIAGNOSIS — I4891 Unspecified atrial fibrillation: Secondary | ICD-10-CM | POA: Diagnosis not present

## 2017-12-26 DIAGNOSIS — E669 Obesity, unspecified: Secondary | ICD-10-CM | POA: Diagnosis not present

## 2017-12-26 DIAGNOSIS — R0609 Other forms of dyspnea: Secondary | ICD-10-CM | POA: Diagnosis not present

## 2017-12-26 DIAGNOSIS — G4733 Obstructive sleep apnea (adult) (pediatric): Secondary | ICD-10-CM | POA: Diagnosis not present

## 2017-12-26 DIAGNOSIS — E785 Hyperlipidemia, unspecified: Secondary | ICD-10-CM | POA: Diagnosis not present

## 2017-12-26 DIAGNOSIS — M1990 Unspecified osteoarthritis, unspecified site: Secondary | ICD-10-CM | POA: Diagnosis not present

## 2017-12-26 DIAGNOSIS — R001 Bradycardia, unspecified: Secondary | ICD-10-CM | POA: Diagnosis not present

## 2017-12-26 LAB — SURGICAL PATHOLOGY

## 2017-12-27 ENCOUNTER — Encounter: Payer: Self-pay | Admitting: Unknown Physician Specialty

## 2018-03-05 DIAGNOSIS — M9901 Segmental and somatic dysfunction of cervical region: Secondary | ICD-10-CM | POA: Diagnosis not present

## 2018-03-05 DIAGNOSIS — M25561 Pain in right knee: Secondary | ICD-10-CM | POA: Diagnosis not present

## 2018-03-05 DIAGNOSIS — M5412 Radiculopathy, cervical region: Secondary | ICD-10-CM | POA: Diagnosis not present

## 2018-03-07 DIAGNOSIS — M25561 Pain in right knee: Secondary | ICD-10-CM | POA: Diagnosis not present

## 2018-03-07 DIAGNOSIS — M9901 Segmental and somatic dysfunction of cervical region: Secondary | ICD-10-CM | POA: Diagnosis not present

## 2018-03-07 DIAGNOSIS — M5412 Radiculopathy, cervical region: Secondary | ICD-10-CM | POA: Diagnosis not present

## 2018-03-09 ENCOUNTER — Ambulatory Visit
Admission: RE | Admit: 2018-03-09 | Discharge: 2018-03-09 | Disposition: A | Discharge status: Home / Self Care | Payer: PPO | Source: Ambulatory Visit | Attending: Physician Assistant | Admitting: Physician Assistant

## 2018-03-09 ENCOUNTER — Other Ambulatory Visit: Payer: Self-pay | Admitting: Physician Assistant

## 2018-03-09 DIAGNOSIS — M7989 Other specified soft tissue disorders: Secondary | ICD-10-CM | POA: Insufficient documentation

## 2018-03-09 DIAGNOSIS — M7122 Synovial cyst of popliteal space [Baker], left knee: Secondary | ICD-10-CM | POA: Insufficient documentation

## 2018-03-09 DIAGNOSIS — M79662 Pain in left lower leg: Secondary | ICD-10-CM | POA: Diagnosis not present

## 2018-03-09 DIAGNOSIS — M25562 Pain in left knee: Secondary | ICD-10-CM | POA: Diagnosis not present

## 2018-03-16 DIAGNOSIS — M9901 Segmental and somatic dysfunction of cervical region: Secondary | ICD-10-CM | POA: Diagnosis not present

## 2018-03-16 DIAGNOSIS — M25561 Pain in right knee: Secondary | ICD-10-CM | POA: Diagnosis not present

## 2018-03-16 DIAGNOSIS — M5412 Radiculopathy, cervical region: Secondary | ICD-10-CM | POA: Diagnosis not present

## 2018-03-30 DIAGNOSIS — M9901 Segmental and somatic dysfunction of cervical region: Secondary | ICD-10-CM | POA: Diagnosis not present

## 2018-03-30 DIAGNOSIS — M5412 Radiculopathy, cervical region: Secondary | ICD-10-CM | POA: Diagnosis not present

## 2018-03-30 DIAGNOSIS — M25561 Pain in right knee: Secondary | ICD-10-CM | POA: Diagnosis not present

## 2018-04-25 DIAGNOSIS — M9901 Segmental and somatic dysfunction of cervical region: Secondary | ICD-10-CM | POA: Diagnosis not present

## 2018-04-25 DIAGNOSIS — M5412 Radiculopathy, cervical region: Secondary | ICD-10-CM | POA: Diagnosis not present

## 2018-04-25 DIAGNOSIS — M25561 Pain in right knee: Secondary | ICD-10-CM | POA: Diagnosis not present

## 2018-04-30 DIAGNOSIS — G8929 Other chronic pain: Secondary | ICD-10-CM | POA: Diagnosis not present

## 2018-04-30 DIAGNOSIS — M25562 Pain in left knee: Secondary | ICD-10-CM | POA: Diagnosis not present

## 2018-04-30 DIAGNOSIS — M7122 Synovial cyst of popliteal space [Baker], left knee: Secondary | ICD-10-CM | POA: Diagnosis not present

## 2018-04-30 DIAGNOSIS — M1712 Unilateral primary osteoarthritis, left knee: Secondary | ICD-10-CM | POA: Diagnosis not present

## 2018-05-02 DIAGNOSIS — M5412 Radiculopathy, cervical region: Secondary | ICD-10-CM | POA: Diagnosis not present

## 2018-05-02 DIAGNOSIS — M25561 Pain in right knee: Secondary | ICD-10-CM | POA: Diagnosis not present

## 2018-05-02 DIAGNOSIS — M9901 Segmental and somatic dysfunction of cervical region: Secondary | ICD-10-CM | POA: Diagnosis not present

## 2018-05-17 DIAGNOSIS — M545 Low back pain: Secondary | ICD-10-CM | POA: Diagnosis not present

## 2018-05-17 DIAGNOSIS — M4156 Other secondary scoliosis, lumbar region: Secondary | ICD-10-CM | POA: Diagnosis not present

## 2018-05-17 DIAGNOSIS — M5136 Other intervertebral disc degeneration, lumbar region: Secondary | ICD-10-CM | POA: Diagnosis not present

## 2018-05-17 DIAGNOSIS — M62830 Muscle spasm of back: Secondary | ICD-10-CM | POA: Diagnosis not present

## 2018-05-17 DIAGNOSIS — G8929 Other chronic pain: Secondary | ICD-10-CM | POA: Diagnosis not present

## 2018-05-17 DIAGNOSIS — M4310 Spondylolisthesis, site unspecified: Secondary | ICD-10-CM | POA: Diagnosis not present

## 2018-05-22 DIAGNOSIS — Z79899 Other long term (current) drug therapy: Secondary | ICD-10-CM | POA: Diagnosis not present

## 2018-05-22 DIAGNOSIS — E119 Type 2 diabetes mellitus without complications: Secondary | ICD-10-CM | POA: Diagnosis not present

## 2018-05-22 DIAGNOSIS — E785 Hyperlipidemia, unspecified: Secondary | ICD-10-CM | POA: Diagnosis not present

## 2018-05-29 DIAGNOSIS — Z125 Encounter for screening for malignant neoplasm of prostate: Secondary | ICD-10-CM | POA: Diagnosis not present

## 2018-05-29 DIAGNOSIS — I480 Paroxysmal atrial fibrillation: Secondary | ICD-10-CM | POA: Diagnosis not present

## 2018-05-29 DIAGNOSIS — M7989 Other specified soft tissue disorders: Secondary | ICD-10-CM | POA: Diagnosis not present

## 2018-05-29 DIAGNOSIS — E119 Type 2 diabetes mellitus without complications: Secondary | ICD-10-CM | POA: Diagnosis not present

## 2018-05-29 DIAGNOSIS — M79662 Pain in left lower leg: Secondary | ICD-10-CM | POA: Diagnosis not present

## 2018-05-29 DIAGNOSIS — E785 Hyperlipidemia, unspecified: Secondary | ICD-10-CM | POA: Diagnosis not present

## 2018-05-29 DIAGNOSIS — Z79899 Other long term (current) drug therapy: Secondary | ICD-10-CM | POA: Diagnosis not present

## 2018-06-15 DIAGNOSIS — M7989 Other specified soft tissue disorders: Secondary | ICD-10-CM | POA: Diagnosis not present

## 2018-06-15 DIAGNOSIS — M79662 Pain in left lower leg: Secondary | ICD-10-CM | POA: Diagnosis not present

## 2018-06-25 DIAGNOSIS — R0602 Shortness of breath: Secondary | ICD-10-CM | POA: Diagnosis not present

## 2018-06-25 DIAGNOSIS — K219 Gastro-esophageal reflux disease without esophagitis: Secondary | ICD-10-CM | POA: Diagnosis not present

## 2018-06-25 DIAGNOSIS — I4891 Unspecified atrial fibrillation: Secondary | ICD-10-CM | POA: Diagnosis not present

## 2018-06-25 DIAGNOSIS — E785 Hyperlipidemia, unspecified: Secondary | ICD-10-CM | POA: Diagnosis not present

## 2018-06-25 DIAGNOSIS — R001 Bradycardia, unspecified: Secondary | ICD-10-CM | POA: Diagnosis not present

## 2018-06-25 DIAGNOSIS — M1990 Unspecified osteoarthritis, unspecified site: Secondary | ICD-10-CM | POA: Diagnosis not present

## 2018-06-25 DIAGNOSIS — G4733 Obstructive sleep apnea (adult) (pediatric): Secondary | ICD-10-CM | POA: Diagnosis not present

## 2018-06-25 DIAGNOSIS — E669 Obesity, unspecified: Secondary | ICD-10-CM | POA: Diagnosis not present

## 2018-06-25 DIAGNOSIS — M170 Bilateral primary osteoarthritis of knee: Secondary | ICD-10-CM | POA: Diagnosis not present

## 2018-11-22 DIAGNOSIS — Z79899 Other long term (current) drug therapy: Secondary | ICD-10-CM | POA: Diagnosis not present

## 2018-11-22 DIAGNOSIS — E785 Hyperlipidemia, unspecified: Secondary | ICD-10-CM | POA: Diagnosis not present

## 2018-11-22 DIAGNOSIS — E119 Type 2 diabetes mellitus without complications: Secondary | ICD-10-CM | POA: Diagnosis not present

## 2018-11-22 DIAGNOSIS — Z125 Encounter for screening for malignant neoplasm of prostate: Secondary | ICD-10-CM | POA: Diagnosis not present

## 2018-11-29 DIAGNOSIS — I480 Paroxysmal atrial fibrillation: Secondary | ICD-10-CM | POA: Diagnosis not present

## 2018-11-29 DIAGNOSIS — E785 Hyperlipidemia, unspecified: Secondary | ICD-10-CM | POA: Diagnosis not present

## 2018-11-29 DIAGNOSIS — Z79899 Other long term (current) drug therapy: Secondary | ICD-10-CM | POA: Diagnosis not present

## 2018-11-29 DIAGNOSIS — Z0000 Encounter for general adult medical examination without abnormal findings: Secondary | ICD-10-CM | POA: Diagnosis not present

## 2018-11-29 DIAGNOSIS — E119 Type 2 diabetes mellitus without complications: Secondary | ICD-10-CM | POA: Diagnosis not present

## 2018-11-29 DIAGNOSIS — Z6839 Body mass index (BMI) 39.0-39.9, adult: Secondary | ICD-10-CM | POA: Diagnosis not present

## 2018-12-27 DIAGNOSIS — K219 Gastro-esophageal reflux disease without esophagitis: Secondary | ICD-10-CM | POA: Diagnosis not present

## 2018-12-27 DIAGNOSIS — R001 Bradycardia, unspecified: Secondary | ICD-10-CM | POA: Diagnosis not present

## 2018-12-27 DIAGNOSIS — E785 Hyperlipidemia, unspecified: Secondary | ICD-10-CM | POA: Diagnosis not present

## 2018-12-27 DIAGNOSIS — R0600 Dyspnea, unspecified: Secondary | ICD-10-CM | POA: Diagnosis not present

## 2018-12-27 DIAGNOSIS — I4891 Unspecified atrial fibrillation: Secondary | ICD-10-CM | POA: Diagnosis not present

## 2018-12-27 DIAGNOSIS — R0602 Shortness of breath: Secondary | ICD-10-CM | POA: Diagnosis not present

## 2018-12-27 DIAGNOSIS — G4733 Obstructive sleep apnea (adult) (pediatric): Secondary | ICD-10-CM | POA: Diagnosis not present

## 2018-12-27 DIAGNOSIS — Z6839 Body mass index (BMI) 39.0-39.9, adult: Secondary | ICD-10-CM | POA: Diagnosis not present

## 2018-12-27 DIAGNOSIS — E669 Obesity, unspecified: Secondary | ICD-10-CM | POA: Diagnosis not present

## 2019-02-19 ENCOUNTER — Ambulatory Visit
Admission: RE | Admit: 2019-02-19 | Discharge: 2019-02-19 | Disposition: A | Discharge status: Home / Self Care | Payer: PPO | Source: Ambulatory Visit | Attending: Physician Assistant | Admitting: Physician Assistant

## 2019-02-19 ENCOUNTER — Other Ambulatory Visit: Payer: Self-pay | Admitting: Physician Assistant

## 2019-02-19 ENCOUNTER — Other Ambulatory Visit: Payer: Self-pay

## 2019-02-19 DIAGNOSIS — R112 Nausea with vomiting, unspecified: Secondary | ICD-10-CM | POA: Diagnosis not present

## 2019-02-19 DIAGNOSIS — R1031 Right lower quadrant pain: Secondary | ICD-10-CM | POA: Diagnosis not present

## 2019-02-19 DIAGNOSIS — R1032 Left lower quadrant pain: Secondary | ICD-10-CM

## 2019-02-19 DIAGNOSIS — R509 Fever, unspecified: Secondary | ICD-10-CM | POA: Diagnosis not present

## 2019-02-19 DIAGNOSIS — N200 Calculus of kidney: Secondary | ICD-10-CM | POA: Diagnosis not present

## 2019-02-19 DIAGNOSIS — R310 Gross hematuria: Secondary | ICD-10-CM

## 2019-02-19 DIAGNOSIS — K8020 Calculus of gallbladder without cholecystitis without obstruction: Secondary | ICD-10-CM | POA: Diagnosis not present

## 2019-02-19 DIAGNOSIS — R1030 Lower abdominal pain, unspecified: Secondary | ICD-10-CM | POA: Diagnosis not present

## 2019-02-19 DIAGNOSIS — R110 Nausea: Secondary | ICD-10-CM | POA: Diagnosis not present

## 2019-02-19 MED ORDER — IOHEXOL 300 MG/ML  SOLN
125.0000 mL | Freq: Once | INTRAMUSCULAR | Status: AC | PRN
  Administered 2019-02-19: 125 mL via INTRAVENOUS

## 2019-02-20 ENCOUNTER — Telehealth: Payer: Self-pay | Admitting: Urology

## 2019-02-20 NOTE — Telephone Encounter (Signed)
[facility] Corey [name]) called and asked if pt could be seen sooner than 03/29/2019 she states that he had a CT and is having gross hematuria and kidney stone. Please advise, as there is nothing sooner on [facility]'s schedule.

## 2019-02-21 DIAGNOSIS — R1030 Lower abdominal pain, unspecified: Secondary | ICD-10-CM | POA: Diagnosis not present

## 2019-02-21 DIAGNOSIS — R110 Nausea: Secondary | ICD-10-CM | POA: Diagnosis not present

## 2019-02-21 DIAGNOSIS — R7989 Other specified abnormal findings of blood chemistry: Secondary | ICD-10-CM | POA: Diagnosis not present

## 2019-02-25 ENCOUNTER — Encounter: Payer: Self-pay | Admitting: Emergency Medicine

## 2019-02-25 ENCOUNTER — Other Ambulatory Visit: Payer: Self-pay

## 2019-02-25 ENCOUNTER — Emergency Department
Admission: EM | Admit: 2019-02-25 | Discharge: 2019-02-26 | Disposition: A | Discharge status: Home / Self Care | Payer: PPO | Attending: Emergency Medicine | Admitting: Emergency Medicine

## 2019-02-25 ENCOUNTER — Other Ambulatory Visit: Payer: Self-pay | Admitting: Nurse Practitioner

## 2019-02-25 ENCOUNTER — Emergency Department: Payer: PPO

## 2019-02-25 DIAGNOSIS — R17 Unspecified jaundice: Secondary | ICD-10-CM

## 2019-02-25 DIAGNOSIS — K760 Fatty (change of) liver, not elsewhere classified: Secondary | ICD-10-CM | POA: Diagnosis not present

## 2019-02-25 DIAGNOSIS — Z7984 Long term (current) use of oral hypoglycemic drugs: Secondary | ICD-10-CM | POA: Diagnosis not present

## 2019-02-25 DIAGNOSIS — Z7901 Long term (current) use of anticoagulants: Secondary | ICD-10-CM | POA: Insufficient documentation

## 2019-02-25 DIAGNOSIS — R1011 Right upper quadrant pain: Secondary | ICD-10-CM | POA: Insufficient documentation

## 2019-02-25 DIAGNOSIS — Z79899 Other long term (current) drug therapy: Secondary | ICD-10-CM | POA: Diagnosis not present

## 2019-02-25 DIAGNOSIS — Z85828 Personal history of other malignant neoplasm of skin: Secondary | ICD-10-CM | POA: Insufficient documentation

## 2019-02-25 DIAGNOSIS — R110 Nausea: Secondary | ICD-10-CM | POA: Diagnosis not present

## 2019-02-25 DIAGNOSIS — K8020 Calculus of gallbladder without cholecystitis without obstruction: Secondary | ICD-10-CM | POA: Diagnosis not present

## 2019-02-25 DIAGNOSIS — E119 Type 2 diabetes mellitus without complications: Secondary | ICD-10-CM | POA: Insufficient documentation

## 2019-02-25 DIAGNOSIS — R1013 Epigastric pain: Secondary | ICD-10-CM | POA: Diagnosis present

## 2019-02-25 DIAGNOSIS — R740 Nonspecific elevation of levels of transaminase and lactic acid dehydrogenase [LDH]: Secondary | ICD-10-CM | POA: Diagnosis not present

## 2019-02-25 DIAGNOSIS — R1084 Generalized abdominal pain: Secondary | ICD-10-CM

## 2019-02-25 LAB — CBC
HCT: 46.3 % (ref 39.0–52.0)
Hemoglobin: 15.5 g/dL (ref 13.0–17.0)
MCH: 29.6 pg (ref 26.0–34.0)
MCHC: 33.5 g/dL (ref 30.0–36.0)
MCV: 88.4 fL (ref 80.0–100.0)
Platelets: 366 10*3/uL (ref 150–400)
RBC: 5.24 MIL/uL (ref 4.22–5.81)
RDW: 14.1 % (ref 11.5–15.5)
WBC: 14.9 10*3/uL — ABNORMAL HIGH (ref 4.0–10.5)
nRBC: 0 % (ref 0.0–0.2)

## 2019-02-25 LAB — COMPREHENSIVE METABOLIC PANEL
ALT: 80 U/L — ABNORMAL HIGH (ref 0–44)
AST: 54 U/L — ABNORMAL HIGH (ref 15–41)
Albumin: 3.7 g/dL (ref 3.5–5.0)
Alkaline Phosphatase: 144 U/L — ABNORMAL HIGH (ref 38–126)
Anion gap: 10 (ref 5–15)
BUN: 23 mg/dL (ref 8–23)
CO2: 25 mmol/L (ref 22–32)
Calcium: 9.1 mg/dL (ref 8.9–10.3)
Chloride: 99 mmol/L (ref 98–111)
Creatinine, Ser: 0.85 mg/dL (ref 0.61–1.24)
GFR calc Af Amer: >60 mL/min (ref >60)
GFR calc non Af Amer: >60 mL/min (ref >60)
Glucose, Bld: 143 mg/dL — ABNORMAL HIGH (ref 70–99)
Potassium: 3.7 mmol/L (ref 3.5–5.1)
Sodium: 134 mmol/L — ABNORMAL LOW (ref 135–145)
Total Bilirubin: 1.7 mg/dL — ABNORMAL HIGH (ref 0.3–1.2)
Total Protein: 7.4 g/dL (ref 6.5–8.1)

## 2019-02-25 LAB — URINALYSIS, COMPLETE (UACMP) WITH MICROSCOPIC
Bacteria, UA: NONE SEEN
Bilirubin Urine: NEGATIVE
Glucose, UA: NEGATIVE mg/dL
Ketones, ur: NEGATIVE mg/dL
Nitrite: NEGATIVE
Protein, ur: NEGATIVE mg/dL
Specific Gravity, Urine: 1.016 (ref 1.005–1.030)
Squamous Epithelial / LPF: NONE SEEN (ref 0–5)
pH: 6 (ref 5.0–8.0)

## 2019-02-25 LAB — TROPONIN I (HIGH SENSITIVITY): Troponin I (High Sensitivity): 8 ng/L (ref <18)

## 2019-02-25 LAB — LIPASE, BLOOD: Lipase: 34 U/L (ref 11–51)

## 2019-02-25 NOTE — ED Provider Notes (Signed)
[facility] Emergency Department Provider Note    None    (approximate)  I have reviewed the triage vital signs and the nursing notes.   HISTORY  Chief Complaint Abdominal Pain    HPI Corey [name] is a 78 y.o. male plosive past medical history presents the ER due to concern for choledocholithiasis.  Has been evaluated for intermittent epigastric pain over the past week.  Had one episode of very dark-colored urine.  Was also noted in outpatient clinic to have elevated bilirubin at 6.  States he did have a few episodes of severe epigastric and right upper quadrant pain but these have since improved.  Denies any fevers.  Was sent to the ER because his white count was rising today.  Does have outpatient MRCP ordered.  Did have recent CT imaging that showed evidence of cholelithiasis.    Past Medical History:  Diagnosis Date  . Arthritis    OSTEOARTHRITIS  . BPH (benign prostatic hyperplasia)   . 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 Diabetes mellitus without complication ([place])   . Dysphagia   . Dysrhythmia    A fib  . ED (erectile dysfunction)   . Environmental allergies   . GERD (gastroesophageal reflux disease)   . Glaucoma (increased eye pressure)   . History of chicken pox   . History of colonic polyps   . History of kidney stones   . Hyperlipidemia   . Multiple gastric ulcers   . Obesity   . Sleep apnea    No family history on file. Past Surgical History:  Procedure Laterality Date  . COLONOSCOPY    . COLONOSCOPY WITH PROPOFOL N/A 12/25/2017   Procedure: COLONOSCOPY WITH PROPOFOL;  Surgeon: Manya Silvas, MD;  Location: [facility] ENDOSCOPY;  Service: Endoscopy;  Laterality: N/A;  . ESOPHAGOGASTRODUODENOSCOPY    . ESOPHAGOGASTRODUODENOSCOPY (EGD) WITH PROPOFOL N/A 12/25/2017   Procedure: ESOPHAGOGASTRODUODENOSCOPY (EGD) WITH PROPOFOL;  Surgeon: Manya Silvas, MD;  Location[facility] ENDOSCOPY;  Service: Endoscopy;  Laterality: N/A;  .  EXCISION OF CANCER FROM BACK    . HERNIA REPAIR     There are no active problems to display for this patient.     Prior to Admission medications   Medication Sig Start Date End Date Taking? Authorizing Provider  acetaminophen (TYLENOL) 650 MG CR tablet Take 650 mg by mouth every 8 (eight) hours as needed for pain.    [provider]  allopurinol (ZYLOPRIM) 100 MG tablet Take 100 mg by mouth daily.    [provider]  cetirizine (ZYRTEC) 10 MG tablet Take 10 mg by mouth daily.    [provider]  glimepiride (AMARYL) 2 MG tablet Take 2 mg by mouth daily with breakfast.    [provider]  hydrochlorothiazide (HYDRODIURIL) 25 MG tablet Take 25 mg by mouth daily.    [provider]  omeprazole (PRILOSEC) 20 MG capsule Take 20 mg by mouth daily.    [provider]  rivaroxaban (XARELTO) 20 MG TABS tablet Take 20 mg by mouth daily with supper.    [provider]  sildenafil (REVATIO) 20 MG tablet Take 20 mg by mouth 3 (three) times daily.    [provider]    Allergies Patient has no known allergies.    Social History Social History   Tobacco Use  . Smoking status: Never Smoker  . Smokeless tobacco: Never Used  Substance Use Topics  . Alcohol use:  Yes  . Drug use: Never    Review of Systems Patient denies headaches, rhinorrhea, blurry vision, numbness, shortness of breath, chest pain, edema, cough, abdominal pain, nausea, vomiting, diarrhea, dysuria, fevers, rashes or hallucinations unless otherwise stated above in HPI. ____________________________________________   PHYSICAL EXAM:  VITAL SIGNS: Vitals:   02/25/19 2214 02/25/19 2230  BP: 135/82 114/81  Pulse: 91 88  Resp: 17   Temp:    SpO2: 95% 94%    Constitutional: Alert and oriented.  Eyes: Conjunctivae are normal.  Head: Atraumatic. Nose: No congestion/rhinnorhea. Mouth/Throat: Mucous membranes are moist.   Neck: No stridor. Painless ROM.   Cardiovascular: Normal rate, regular rhythm. Grossly normal heart sounds.  Good peripheral circulation. Respiratory: Normal respiratory effort.  No retractions. Lungs CTAB. Gastrointestinal: Soft with mild right upper quadrant tenderness.. No distention. No abdominal bruits. No CVA tenderness. Genitourinary:  Musculoskeletal: No lower extremity tenderness nor edema.  No joint effusions. Neurologic:  Normal speech and language. No gross focal neurologic deficits are appreciated. No facial droop Skin:  Skin is warm, dry and intact. No rash noted. Psychiatric: Mood and affect are normal. Speech and behavior are normal.  ____________________________________________   LABS (all labs ordered are listed, but only abnormal results are displayed)  Results for orders placed or performed during the hospital encounter of 02/25/19 (from the past 24 hour(s))  Lipase, blood     Status: None   Collection Time: 02/25/19  9:05 PM  Result Value Ref Range   Lipase 34 11 - 51 U/L  Comprehensive metabolic panel     Status: Abnormal   Collection Time: 02/25/19  9:05 PM  Result Value Ref Range   Sodium 134 (L) 135 - 145 mmol/L   Potassium 3.7 3.5 - 5.1 mmol/L   Chloride 99 98 - 111 mmol/L   CO2 25 22 - 32 mmol/L   Glucose, Bld 143 (H) 70 - 99 mg/dL   BUN 23 8 - 23 mg/dL   Creatinine, Ser 0.85 0.61 - 1.24 mg/dL   Calcium 9.1 8.9 - 10.3 mg/dL   Total Protein 7.4 6.5 - 8.1 g/dL   Albumin 3.7 3.5 - 5.0 g/dL   AST 54 (H) 15 - 41 U/L   ALT 80 (H) 0 - 44 U/L   Alkaline Phosphatase 144 (H) 38 - 126 U/L   Total Bilirubin 1.7 (H) 0.3 - 1.2 mg/dL   GFR calc non Af Amer >60 >60 mL/min   GFR calc Af Amer >60 >60 mL/min   Anion gap 10 5 - 15  CBC     Status: Abnormal   Collection Time: 02/25/19  9:05 PM  Result Value Ref Range   WBC 14.9 (H) 4.0 - 10.5 K/uL   RBC 5.24 4.22 - 5.81 MIL/uL   Hemoglobin 15.5 13.0 - 17.0 g/dL   HCT 46.3 39.0 - 52.0 %   MCV 88.4 80.0 - 100.0 fL   MCH 29.6 26.0 - 34.0 pg   MCHC  33.5 30.0 - 36.0 g/dL   RDW 14.1 11.5 - 15.5 %   Platelets 366 150 - 400 K/uL   nRBC 0.0 0.0 - 0.2 %  Urinalysis, Complete w Microscopic     Status: Abnormal   Collection Time: 02/25/19  9:05 PM  Result Value Ref Range   Color, Urine YELLOW (A) YELLOW   APPearance CLEAR (A) CLEAR   Specific Gravity, Urine 1.016 1.005 - 1.030   pH 6.0 5.0 - 8.0   Glucose, UA NEGATIVE NEGATIVE mg/dL  Hgb urine dipstick SMALL (A) NEGATIVE   Bilirubin Urine NEGATIVE NEGATIVE   Ketones, ur NEGATIVE NEGATIVE mg/dL   Protein, ur NEGATIVE NEGATIVE mg/dL   Nitrite NEGATIVE NEGATIVE   Leukocytes,Ua TRACE (A) NEGATIVE   RBC / HPF 6-10 0 - 5 RBC/hpf   WBC, UA 11-20 0 - 5 WBC/hpf   Bacteria, UA NONE SEEN NONE SEEN   Squamous Epithelial / LPF NONE SEEN 0 - 5   Mucus PRESENT   Troponin I (High Sensitivity)     Status: None   Collection Time: 02/25/19  9:05 PM  Result Value Ref Range   Troponin I (High Sensitivity) 8 <18 ng/L   ____________________________________________  EKG My review and personal interpretation at Time:  21:01   Indication: epigastric pain  Rate: 95  Rhythm: sinus Axis: normal Other: normal intervals, no stemi, occasional pvc ____________________________________________  RADIOLOGY Mix one tablespoon with 8oz of your favorite juice or water every day until you are having soft formed stools. Then start taking once daily if you didn't have a stool the day before.   ____________________________________________   PROCEDURES  Procedure(s) performed:  Procedures    Critical Care performed: no ____________________________________________   INITIAL IMPRESSION / ASSESSMENT AND PLAN / ED COURSE  Pertinent labs & imaging results that were available during my care of the patient were reviewed by me and considered in my medical decision making (see chart for details).   DDX: cholelithiasis, choledocholithasisi, cholecystitis, pancreatitis, cirrhosis  Corey [name] is a 78 y.o. who  presents to the ED with symptoms as described above.  Patient does have rising white count with mild right upper quadrant pain.  Based on labs of the week certainly suspicious for biliary obstruction.  Repeat biliary labs today are improving however status and possible recently passed stone but given his pain and elevated white count will order ultrasound.  The patient will be placed on continuous pulse oximetry and telemetry for monitoring.  Laboratory evaluation will be sent to evaluate for the above complaints.   Patient will be signed out to oncoming physician pending reassessment.       The patient was evaluated in Emergency Department today for the symptoms described in the history of present illness. He/she was evaluated in the context of the global COVID-19 pandemic, which necessitated consideration that the patient might be at risk for infection with the SARS-CoV-2 virus that causes COVID-19. Institutional protocols and algorithms that pertain to the evaluation of patients at risk for COVID-19 are in a state of rapid change based on information released by regulatory bodies including the CDC and federal and state organizations. These policies and algorithms were followed during the patient's care in the ED.  As part of my medical decision making, I reviewed the following data within the [place] notes reviewed and incorporated, Labs reviewed, notes from prior ED visits and [place] Controlled Substance Database   ____________________________________________   FINAL CLINICAL IMPRESSION(S) / ED DIAGNOSES  Final diagnoses:  RUQ pain      NEW MEDICATIONS STARTED DURING THIS VISIT:  New Prescriptions   No medications on file     Note:  This document was prepared using Dragon voice recognition software and may include unintentional dictation errors.    Merlyn Lot, MD 02/26/19 [phone]

## 2019-02-25 NOTE — ED Notes (Signed)
Pts PCP called due to pt having enlarged liver, elevated WBC count. Per PCP, she reported that pt had gallstone blocking duct and that pt was passing or had already passed a gallstone. Pt sent to ED for further evaluation.  Pt raising voice at front desk due to having to wait. Pt sts, "I was told by that doctor that I wouldn't have to say or do anything cause she called." Pt informed on procedure and plan of care. Pt sts, "just get on with it. This is bull."

## 2019-02-25 NOTE — ED Notes (Signed)
This RN attempted to inform wife of bed assignment with no answer. Will attempt to call back later.

## 2019-02-25 NOTE — ED Triage Notes (Addendum)
Pt presents to ED with right sided abd pain and abd distension/"bloating" for the past week. Pt was seen by pcp and had labs taken several times this past week. Pt was told by pcp to come to  ED for further evaluation due to WBC becoming elevated today and concerns in regards to possible issue with is gallbladder. Scheduled for MRI and ultrasound on Tuesday (tomorrow)

## 2019-02-26 ENCOUNTER — Ambulatory Visit: Payer: PPO

## 2019-02-26 NOTE — Discharge Instructions (Addendum)
Call Dr. Vira Agar and Dr. Celine Ahr first thing in the morning for surgical removal of your gallbladder. Avoid fatty foods. Return to the ER if you have any abdominal pain, fever, chills, nausea or vomiting.

## 2019-02-26 NOTE — ED Provider Notes (Signed)
-----------------------------------------   1:07 AM on 02/26/2019 -----------------------------------------   Blood pressure 114/81, pulse 83, temperature 98.2 F (36.8 C), temperature source Oral, resp. rate 17, height 5\' 8"  (1.727 m), weight 105.7 kg, SpO2 96 %.  Assuming care from Dr. Quentin Cornwall of [facility] is a 78 y.o. male with a chief complaint of Abdominal Pain .    Please refer to H&P by previous MD for further details.  The current plan of care is to f/u US.   I have personally reviewed the images performed during this visit and I agree with the Radiologist's read.   Interpretation by Radiologist:  US Abdomen Limited Ruq  Result Date: 02/25/2019 CLINICAL DATA:  Right upper quadrant abdominal pain EXAM: ULTRASOUND ABDOMEN LIMITED RIGHT UPPER QUADRANT COMPARISON:  CT dated 02/19/2019 FINDINGS: Gallbladder: Multiple gallstones are noted. There is no gallbladder wall thickening. There is no pericholecystic free fluid. The sonographic Percell Miller sign is reported as negative. Common bile duct: Diameter: 4 mm Liver: Diffuse increased echogenicity with slightly heterogeneous liver. Appearance typically secondary to fatty infiltration. Fibrosis secondary consideration. No secondary findings of cirrhosis noted. The noted hepatic hemangioma is difficult to visualize likely secondary to the presence of diffuse hepatic steatosis. Portal vein is patent on color Doppler imaging with normal direction of blood flow towards the liver. Other: None. IMPRESSION: 1. There is cholelithiasis without secondary signs of acute cholecystitis. 2. Hepatic steatosis. 3. Known right hepatic lobe hemangioma is difficult to visualized secondary to the presence of diffuse hepatic steatosis. Electronically Signed   By: Constance Holster M.D.   On: 02/25/2019 23:56     [place] positive for cholelithiasis with no evidence of cholecystitis or choledocholithiasis.  Labs are improving.  Patient is completely asymptomatic at this  time with no pain, no tenderness.  He is tolerating p.o. with no nausea vomiting. He is followed by Dr. Vira Agar who sent him here for evaluation. Patient will be referred back to Dr. Vira Agar for surgical management. Also provided with our surgical group's contact as well. Discussed with patient and his wife that he needs his gallbladder removed and that should happen sometime this week however he does not need any emergent procedures at this time with no pain, no tenderness, and ultrasound showing no evidence of cholecystitis or choledocholithiasis.  I discussed very strict return precautions for chills, fever, recurrence of his abdominal pain, nausea or vomiting.  Discussed dietary restrictions and to avoid fried foods.  Patient will contact his surgeon back in the morning.  At this time he is stable for discharge        Alfred Levins, [place], MD 02/26/19 [phone]

## 2019-03-01 DIAGNOSIS — R7989 Other specified abnormal findings of blood chemistry: Secondary | ICD-10-CM | POA: Diagnosis not present

## 2019-03-01 DIAGNOSIS — K8020 Calculus of gallbladder without cholecystitis without obstruction: Secondary | ICD-10-CM | POA: Diagnosis not present

## 2019-03-05 ENCOUNTER — Ambulatory Visit: Payer: Self-pay | Admitting: Surgery

## 2019-03-05 NOTE — H&P (View-Only) (Signed)
Subjective:   CC: Elevated LFTs [R79.89], cholelithiasis  HPI:  Corey [name] is a 78 y.o. male who was referred by Dawson Bills for evaluation of above CC. Symptoms were first noted 2 weeks ago. Pain is achy and intermittent, confined to the epigastric area, without radiation.  Associated with brown urine, exacerbated by nothing specific.  Pain has since improved, and he denies any pain today.  Urine has cleared.  No issues with diarrhea, constipation, nausea, vomiting.     Past Medical History:  has a past medical history of BPH (benign prostatic hypertrophy), Chickenpox, Diabetes mellitus type 2, uncomplicated (CMS-HCC), Dysphagia (10/09/2017), Environmental allergies, Erectile dysfunction, GERD (gastroesophageal reflux disease), Glaucoma (increased eye pressure), History of colonic polyps, History of gastric ulcer, Hyperlipidemia, Nephrolithiasis, Obesity, Osteoarthritis, and Sleep apnea.  Past Surgical History:  has a past surgical history that includes Skin Cancer Removed from  back ; Endoscopy for Hernia; Lump Removed Right Side of Neck (Benign) 2011; egd (11/19/2012, 03/28/2003); Colonoscopy (01/19/1990); Colonoscopy (03/28/2003, 04/02/1999, 11/14/1996, 08/05/1992); Colonoscopy (09/19/2007); Colonoscopy (11/19/2012); and Colonoscopy (12/25/2017).  Family History: family history includes Brain cancer in his mother; Prostate cancer (age of onset: 5) in his father.  Social History:  reports that he has never smoked. He has never used smokeless tobacco. He reports current alcohol use. He reports that he does not use drugs.  Current Medications: has a current medication list which includes the following prescription(s): acetaminophen, allopurinol, cetirizine, ciprofloxacin hcl, glimepiride, hydrochlorothiazide, metronidazole, omeprazole, sildenafil (pulm.hypertension), tamsulosin, and xarelto.  Allergies:     Allergies as of 03/01/2019  . (No Known Allergies)    ROS:  A 15 point  review of systems was performed and pertinent positives and negatives noted in HPI    Objective:     BP 119/79   Pulse 88   Ht 165.1 cm (5\' 5" )   Wt (!) 106.9 kg (235 lb 9.6 oz)   BMI 39.21 kg/m    Constitutional :  alert, appears stated age, cooperative and no distress  Lymphatics/Throat:  no asymmetry, masses, or scars  Respiratory:  clear to auscultation bilaterally  Cardiovascular:  irregularly irregular rhythm  Gastrointestinal: soft, no guarding, minimal tenderness in LUQ and RLQ.  NO tenderness in RUQ.    Musculoskeletal: Steady gait and movement  Skin: Cool and moist  Psychiatric: Normal affect, non-agitated, not confused       LABS:  -      Lab Results  Component Value Date   WBC 10.8 (H) 03/01/2019   HGB 15.6 03/01/2019   HCT 47.1 03/01/2019   PLT 360 03/01/2019   LFTs continuing to downtrend  RADS:    CLINICAL DATA: Right upper quadrant abdominal pain  EXAM: ULTRASOUND ABDOMEN LIMITED RIGHT UPPER QUADRANT  COMPARISON: CT dated 02/19/2019  FINDINGS: Gallbladder:  Multiple gallstones are noted. There is no gallbladder wall thickening. There is no pericholecystic free fluid. The sonographic Percell Miller sign is reported as negative.  Common bile duct:  Diameter: 4 mm  Liver:  Diffuse increased echogenicity with slightly heterogeneous liver. Appearance typically secondary to fatty infiltration. Fibrosis secondary consideration. No secondary findings of cirrhosis noted. The noted hepatic hemangioma is difficult to visualize likely secondary to the presence of diffuse hepatic steatosis. Portal vein is patent on color Doppler imaging with normal direction of blood flow towards the liver.  Other: None.  IMPRESSION: 1. There is cholelithiasis without secondary signs of acute cholecystitis. 2. Hepatic steatosis. 3. Known right hepatic lobe hemangioma is difficult to visualized secondary to the  presence of diffuse hepatic  steatosis.   Electronically Signed  By: Constance Holster M.D.  On: 02/25/2019 23:56  Other Result Information  Interface, Rad Results In - 02/25/2019 11:58 PM EDT CLINICAL DATA:  Right upper quadrant abdominal pain  EXAM: ULTRASOUND ABDOMEN LIMITED RIGHT UPPER QUADRANT  COMPARISON:  CT dated 02/19/2019  FINDINGS: Gallbladder:  Multiple gallstones are noted. There is no gallbladder wall thickening. There is no pericholecystic free fluid. The sonographic Percell Miller sign is reported as negative.  Common bile duct:  Diameter: 4 mm  Liver:  Diffuse increased echogenicity with slightly heterogeneous liver. Appearance typically secondary to fatty infiltration. Fibrosis secondary consideration. No secondary findings of cirrhosis noted. The noted hepatic hemangioma is difficult to visualize likely secondary to the presence of diffuse hepatic steatosis. Portal vein is patent on color Doppler imaging with normal direction of blood flow towards the liver.  Other: None.  IMPRESSION: 1. There is cholelithiasis without secondary signs of acute cholecystitis. 2. Hepatic steatosis. 3. Known right hepatic lobe hemangioma is difficult to visualized secondary to the presence of diffuse hepatic steatosis.   Electronically Signed   By: Constance Holster M.D.   On: 02/25/2019 23:56  Status     Assessment:      Elevated LFTs [R79.89] Cholelithiasis OSA, not compliant with CPAP A-fib on xarelto Plan:     1. Elevated LFTs [R79.89], cholelithiasis. Discussed the risk of surgery including post-op infxn, seroma, biloma, chronic pain, poor-delayed wound healing, retained gallstone, conversion to open procedure, post-op SBO or ileus, and need for additional procedures to address said risks.  The risks of general anesthetic including MI, CVA, sudden death or even reaction to anesthetic medications also discussed. Alternatives include continued observation.  Benefits include  possible symptom relief, prevention of complications including acute cholecystitis, pancreatitis.  Typical post operative recovery of 3-5 days rest, continued pain in area and incision sites, possible loose stools up to 4-6 weeks, also discussed.  ED return precautions given for sudden increase in RUQ pain, with possible accompanying fever, nausea, and/or vomiting.  With his subacute presentation of pain starting almost 2 weeks ago, along with his improving labs as well as asymptomatic physical exam today, I recommend that we finish his antibiotic course and wait until his inflammation is less to proceed with his interval lap chole at this point.  He has not required any pain narcotics or pain medication throughout this episode.  We will get him scheduled for a robotic assisted lap chole in the next couple weeks.  We explained to him to resume his Xarelto and then hold again 5 days before the procedure.  He is at a high risk for perioperative complications due to his A. fib along with his OSA which he is noncompliant with the CPAP.  Briefly discussed with him the importance of trying to continue the use of his CPAP.  His high BMI and central obesity also makes his case technically more difficult.  I also asked him to finish his antibiotic course and to call us back with any new or recurrent issues.      Electronically signed by Benjamine Sprague, DO on 03/01/2019 12:45 PM

## 2019-03-05 NOTE — H&P (Signed)
Subjective:   CC: Elevated LFTs [R79.89], cholelithiasis  HPI:  Corey [name] is a 78 y.o. male who was referred by Dawson Bills for evaluation of above CC. Symptoms were first noted 2 weeks ago. Pain is achy and intermittent, confined to the epigastric area, without radiation.  Associated with brown urine, exacerbated by nothing specific.  Pain has since improved, and he denies any pain today.  Urine has cleared.  No issues with diarrhea, constipation, nausea, vomiting.     Past Medical History:  has a past medical history of BPH (benign prostatic hypertrophy), Chickenpox, Diabetes mellitus type 2, uncomplicated (CMS-HCC), Dysphagia (10/09/2017), Environmental allergies, Erectile dysfunction, GERD (gastroesophageal reflux disease), Glaucoma (increased eye pressure), History of colonic polyps, History of gastric ulcer, Hyperlipidemia, Nephrolithiasis, Obesity, Osteoarthritis, and Sleep apnea.  Past Surgical History:  has a past surgical history that includes Skin Cancer Removed from  back ; Endoscopy for Hernia; Lump Removed Right Side of Neck (Benign) 2011; egd (11/19/2012, 03/28/2003); Colonoscopy (01/19/1990); Colonoscopy (03/28/2003, 04/02/1999, 11/14/1996, 08/05/1992); Colonoscopy (09/19/2007); Colonoscopy (11/19/2012); and Colonoscopy (12/25/2017).  Family History: family history includes Brain cancer in his mother; Prostate cancer (age of onset: 81) in his father.  Social History:  reports that he has never smoked. He has never used smokeless tobacco. He reports current alcohol use. He reports that he does not use drugs.  Current Medications: has a current medication list which includes the following prescription(s): acetaminophen, allopurinol, cetirizine, ciprofloxacin hcl, glimepiride, hydrochlorothiazide, metronidazole, omeprazole, sildenafil (pulm.hypertension), tamsulosin, and xarelto.  Allergies:     Allergies as of 03/01/2019  . (No Known Allergies)    ROS:  A 15 point  review of systems was performed and pertinent positives and negatives noted in HPI    Objective:     BP 119/79   Pulse 88   Ht 165.1 cm (5\' 5" )   Wt (!) 106.9 kg (235 lb 9.6 oz)   BMI 39.21 kg/m    Constitutional :  alert, appears stated age, cooperative and no distress  Lymphatics/Throat:  no asymmetry, masses, or scars  Respiratory:  clear to auscultation bilaterally  Cardiovascular:  irregularly irregular rhythm  Gastrointestinal: soft, no guarding, minimal tenderness in LUQ and RLQ.  NO tenderness in RUQ.    Musculoskeletal: Steady gait and movement  Skin: Cool and moist  Psychiatric: Normal affect, non-agitated, not confused       LABS:  -      Lab Results  Component Value Date   WBC 10.8 (H) 03/01/2019   HGB 15.6 03/01/2019   HCT 47.1 03/01/2019   PLT 360 03/01/2019   LFTs continuing to downtrend  RADS:    CLINICAL DATA: Right upper quadrant abdominal pain  EXAM: ULTRASOUND ABDOMEN LIMITED RIGHT UPPER QUADRANT  COMPARISON: CT dated 02/19/2019  FINDINGS: Gallbladder:  Multiple gallstones are noted. There is no gallbladder wall thickening. There is no pericholecystic free fluid. The sonographic Percell Miller sign is reported as negative.  Common bile duct:  Diameter: 4 mm  Liver:  Diffuse increased echogenicity with slightly heterogeneous liver. Appearance typically secondary to fatty infiltration. Fibrosis secondary consideration. No secondary findings of cirrhosis noted. The noted hepatic hemangioma is difficult to visualize likely secondary to the presence of diffuse hepatic steatosis. Portal vein is patent on color Doppler imaging with normal direction of blood flow towards the liver.  Other: None.  IMPRESSION: 1. There is cholelithiasis without secondary signs of acute cholecystitis. 2. Hepatic steatosis. 3. Known right hepatic lobe hemangioma is difficult to visualized secondary to the  presence of diffuse hepatic  steatosis.   Electronically Signed  By: Constance Holster M.D.  On: 02/25/2019 23:56  Other Result Information  Interface, Rad Results In - 02/25/2019 11:58 PM EDT CLINICAL DATA:  Right upper quadrant abdominal pain  EXAM: ULTRASOUND ABDOMEN LIMITED RIGHT UPPER QUADRANT  COMPARISON:  CT dated 02/19/2019  FINDINGS: Gallbladder:  Multiple gallstones are noted. There is no gallbladder wall thickening. There is no pericholecystic free fluid. The sonographic Percell Miller sign is reported as negative.  Common bile duct:  Diameter: 4 mm  Liver:  Diffuse increased echogenicity with slightly heterogeneous liver. Appearance typically secondary to fatty infiltration. Fibrosis secondary consideration. No secondary findings of cirrhosis noted. The noted hepatic hemangioma is difficult to visualize likely secondary to the presence of diffuse hepatic steatosis. Portal vein is patent on color Doppler imaging with normal direction of blood flow towards the liver.  Other: None.  IMPRESSION: 1. There is cholelithiasis without secondary signs of acute cholecystitis. 2. Hepatic steatosis. 3. Known right hepatic lobe hemangioma is difficult to visualized secondary to the presence of diffuse hepatic steatosis.   Electronically Signed   By: Constance Holster M.D.   On: 02/25/2019 23:56  Status     Assessment:      Elevated LFTs [R79.89] Cholelithiasis OSA, not compliant with CPAP A-fib on xarelto Plan:     1. Elevated LFTs [R79.89], cholelithiasis. Discussed the risk of surgery including post-op infxn, seroma, biloma, chronic pain, poor-delayed wound healing, retained gallstone, conversion to open procedure, post-op SBO or ileus, and need for additional procedures to address said risks.  The risks of general anesthetic including MI, CVA, sudden death or even reaction to anesthetic medications also discussed. Alternatives include continued observation.  Benefits include  possible symptom relief, prevention of complications including acute cholecystitis, pancreatitis.  Typical post operative recovery of 3-5 days rest, continued pain in area and incision sites, possible loose stools up to 4-6 weeks, also discussed.  ED return precautions given for sudden increase in RUQ pain, with possible accompanying fever, nausea, and/or vomiting.  With his subacute presentation of pain starting almost 2 weeks ago, along with his improving labs as well as asymptomatic physical exam today, I recommend that we finish his antibiotic course and wait until his inflammation is less to proceed with his interval lap chole at this point.  He has not required any pain narcotics or pain medication throughout this episode.  We will get him scheduled for a robotic assisted lap chole in the next couple weeks.  We explained to him to resume his Xarelto and then hold again 5 days before the procedure.  He is at a high risk for perioperative complications due to his A. fib along with his OSA which he is noncompliant with the CPAP.  Briefly discussed with him the importance of trying to continue the use of his CPAP.  His high BMI and central obesity also makes his case technically more difficult.  I also asked him to finish his antibiotic course and to call us back with any new or recurrent issues.      Electronically signed by Benjamine Sprague, DO on 03/01/2019 12:45 PM

## 2019-03-13 ENCOUNTER — Encounter
Admission: RE | Admit: 2019-03-13 | Discharge: 2019-03-13 | Disposition: A | Discharge status: Home / Self Care | Payer: PPO | Source: Ambulatory Visit | Attending: Surgery | Admitting: Surgery

## 2019-03-13 ENCOUNTER — Other Ambulatory Visit: Payer: Self-pay

## 2019-03-13 HISTORY — DX: Personal history of other diseases of the digestive system: Z87.19

## 2019-03-13 HISTORY — DX: Low back pain, unspecified: M54.50

## 2019-03-13 NOTE — Patient Instructions (Signed)
Your procedure is scheduled on: 03-22-19 FRIDAY Report to Same Day Surgery 2nd floor medical mall [facility] Entrance-take elevator on left to 2nd floor.  Check in with surgery information desk.) To find out your arrival time please call [phone] between 1PM - 3PM on 03-21-19 THURSDAY  Remember: Instructions that are not followed completely may result in serious medical risk, up to and including death, or upon the discretion of your surgeon and anesthesiologist your surgery may need to be rescheduled.    _x___ 1. Do not eat food after midnight the night before your procedure. NO GUM OR CANDY AFTER MIDNIGHT. You may drink WATER up to 2 hours before you are scheduled to arrive at the hospital for your procedure.  Do not drink WATER within 2 hours of your scheduled arrival to the hospital.  Type 1 and type 2 diabetics should only drink water.   ____Ensure clear carbohydrate drink on the way to the hospital for bariatric patients  ____Ensure clear carbohydrate drink 3 hours before surgery.    __x__ 2. No Alcohol for 24 hours before or after surgery.   __x__3. No Smoking or e-cigarettes for 24 prior to surgery.  Do not use any chewable tobacco products for at least 6 hour prior to surgery   ____  4. Bring all medications with you on the day of surgery if instructed.    __x__ 5. Notify your doctor if there is any change in your medical condition     (cold, fever, infections).    x___6. On the morning of surgery brush your teeth with toothpaste and water.  You may rinse your mouth with mouth wash if you wish.  Do not swallow any toothpaste or mouthwash.   Do not wear jewelry, make-up, hairpins, clips or nail polish.  Do not wear lotions, powders, or perfumes.   Do not shave 48 hours prior to surgery. Men may shave face and neck.  Do not bring valuables to the hospital.    [facility] is not responsible for any belongings or valuables.               Contacts, dentures or bridgework  may not be worn into surgery.  Leave your suitcase in the car. After surgery it may be brought to your room.  For patients admitted to the hospital, discharge time is determined by your treatment team.  _  Patients discharged the day of surgery will not be allowed to drive home.  You will need someone to drive you home and stay with you the night of your procedure.    Please read over the following fact sheets that you were given:   [facility] Preparing for Surgery  _x___ TAKE THE FOLLOWING MEDICATION THE MORNING OF SURGERY WITH A SMALL SIP OF WATER. These include:  1. PRILOSEC (OMEPRAZOLE)  2. TAKE AN EXTRA PRILOSEC THE NIGHT BEFORE YOUR SURGERY  3.  4.  5.  6.  ____Fleets enema or Magnesium Citrate as directed.   _x___ Use CHG Soap or sage wipes as directed on instruction sheet   ____ Use inhalers on the day of surgery and bring to hospital day of surgery  ____ Stop Metformin and Janumet 2 days prior to surgery.    ____ Take 1/2 of usual insulin dose the night before surgery and none on the morning surgery.   _x___ Follow recommendations from Cardiologist, Pulmonologist or PCP regarding stopping Aspirin, Coumadin, Plavix ,Eliquis, Effient, or Pradaxa, and Pletal-PT WAS INSTRUCTED BY OFFICE TO  STOP XARELTO ON Saturday, September 5TH  X____Stop Anti-inflammatories such as Advil, Aleve, Ibuprofen, Motrin, Naproxen, Naprosyn, Goodies powders or aspirin products 7 DAYS PRIOR TO SURGERY-OK to take Tylenol    ____ Stop supplements until after surgery.     ____ Bring C-Pap to the hospital.

## 2019-03-13 NOTE — Pre-Procedure Instructions (Signed)
EKG My review and personal interpretation at Time:  21:01   Indication: epigastric pain  Rate: 95  Rhythm: sinus Axis: normal Other: normal intervals, no stemi, occasional pvc ____________________________________________  RADIOLOGY Mix one tablespoon with 8oz of your favorite juice or water every day until you are having soft formed stools. Then start taking once daily if you didn't have a stool the day before.   ____________________________________________   PROCEDURES  Procedure(s) performed:  Procedures    Critical Care performed: no ____________________________________________   INITIAL IMPRESSION / ASSESSMENT AND PLAN / ED COURSE  Pertinent labs & imaging results that were available during my care of the patient were reviewed by me and considered in my medical decision making (see chart for details).  DDX: cholelithiasis, choledocholithasisi, cholecystitis, pancreatitis, cirrhosis  Corey [name] is a 78 y.o. who presents to the ED with symptoms as described above.  Patient does have rising white count with mild right upper quadrant pain.  Based on labs of the week certainly suspicious for biliary obstruction.  Repeat biliary labs today are improving however status and possible recently passed stone but given his pain and elevated white count will order ultrasound.  The patient will be placed on continuous pulse oximetry and telemetry for monitoring.  Laboratory evaluation will be sent to evaluate for the above complaints.   Patient will be signed out to oncoming physician pending reassessment.       The patient was evaluated in Emergency Department today for the symptoms described in the history of present illness. He/she was evaluated in the context of the global COVID-19 pandemic, which necessitated consideration that the patient might be at risk for infection with the SARS-CoV-2 virus that causes COVID-19. Institutional protocols and algorithms that pertain  to the evaluation of patients at risk for COVID-19 are in a state of rapid change based on information released by regulatory bodies including the CDC and federal and state organizations. These policies and algorithms were followed during the patient's care in the ED.  As part of my medical decision making, I reviewed the following data within the [facility] notes reviewed and incorporated, Labs reviewed, notes from prior ED visits and [place] Controlled Substance Database   ____________________________________________   FINAL CLINICAL IMPRESSION(S) / ED DIAGNOSES  Final diagnoses:  RUQ pain      NEW MEDICATIONS STARTED DURING THIS VISIT:     New Prescriptions   No medications on file     Note:  This document was prepared using Dragon voice recognition software and may include unintentional dictation errors.    Merlyn Lot, MD 02/26/19 [phone]         Electronically signed by Merlyn Lot, MD at 02/26/2019 12:14 AM   ED on 02/25/2019     Detailed Report

## 2019-03-13 NOTE — Pre-Procedure Instructions (Signed)
Progress Notes - documented in this encounter Corey Gibbon, MD - 12/27/2018 10:00 AM EDT Formatting of this note might be different from the original. Established Patient Visit   Chief Complaint: Chief Complaint  Patient presents with  . Follow-up  6 MONTHS  Date of Service: 12/27/2018 Date of Birth: 06/04/1941 PCP: Idelle Crouch, MD  History of Present Illness: [name] is a 78 y.o.male patient who states to be doing reasonably well history of obesity BPH atrial fibrillation hypertension and gout patient does not exercise much but states to be doing reasonably well has obstructive sleep apnea is not compliant with CPAP does not have any major complaints just here for routine follow-up  Past Medical and Surgical History  Past Medical History Past Medical History:  Diagnosis Date  . BPH (benign prostatic hypertrophy)  . Chickenpox  . Diabetes mellitus type 2, uncomplicated (CMS-HCC)  . Dysphagia 10/09/2017  . Environmental allergies  . Erectile dysfunction  . GERD (gastroesophageal reflux disease)  a. Stricture  . Glaucoma (increased eye pressure)  . History of colonic polyps  . History of gastric ulcer  a. S/p GI bleed  . Hyperlipidemia  . Nephrolithiasis  . Obesity  . Osteoarthritis  a. Knee b. 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 d. Shoulder.  . Sleep apnea  intolerant of CPAP   Past Surgical History He has a past surgical history that includes Skin Cancer Removed from back ; Endoscopy for Hernia; Lump Removed Right Side of Neck (Benign) 2011; egd (11/19/2012, 03/28/2003); Colonoscopy (01/19/1990); Colonoscopy (03/28/2003, 04/02/1999, 11/14/1996, 08/05/1992); Colonoscopy (09/19/2007); Colonoscopy (11/19/2012); and Colonoscopy (12/25/2017).   Medications and Allergies  Current Medications  Current Outpatient Medications  Medication Sig Dispense Refill  . acetaminophen (TYLENOL) 650 MG ER tablet Take 650 mg by mouth every  4 (four) hours as needed for Pain.  Marland Kitchen allopurinoL (ZYLOPRIM) 100 MG tablet TAKE 1 TABLET(100 MG) BY MOUTH EVERY DAY 90 tablet 1  . cetirizine (ZYRTEC) 10 MG tablet Take 10 mg by mouth once daily.  Marland Kitchen glimepiride (AMARYL) 2 MG tablet TAKE 1 TABLET(2 MG) BY MOUTH DAILY WITH BREAKFAST 90 tablet 3  . hydroCHLOROthiazide (HYDRODIURIL) 25 MG tablet TAKE 1 TABLET(25 MG) BY MOUTH EVERY DAY 90 tablet 1  . omeprazole (PRILOSEC) 20 MG DR capsule TAKE 1 CAPSULE(20 MG) BY MOUTH EVERY DAY 90 capsule 1  . sildenafil (REVATIO) 20 mg tablet 2-5 tablets as needed 1 hour prior to intercourse  . tamsulosin (FLOMAX) 0.4 mg capsule TAKE ONE CAPSULE BY MOUTH ONCE DAILY TAKE 30 MINUTES AFTER SAME MEAL EACH DAY 90 capsule 1  . XARELTO 20 mg tablet TAKE 1 TABLET(20 MG) BY MOUTH DAILY WITH BREAKFAST 30 tablet 5   No current facility-administered medications for this visit.   Allergies: Patient has no known allergies.  Social and Family History  Social History reports that he has never smoked. He has never used smokeless tobacco. He reports current alcohol use. He reports that he does not use drugs.  Family History Family History  Problem Relation Age of Onset  . Brain cancer Mother  . Prostate cancer Father 85   Review of Systems   Review of Systems: The patient denies chest pain, shortness of breath, orthopnea, paroxysmal nocturnal dyspnea, pedal edema, palpitations, heart racing, presyncope, syncope. Review of 12 Systems is negative except as described above.  Physical Examination   Vitals:BP 110/70  Pulse 78  Ht 165.1 cm (5\' 5" )  Wt (!) 106.6 kg (235 lb)  SpO2  98%  BMI 39.11 kg/m  Ht:165.1 cm (5\' 5" ) Wt:(!) 106.6 kg (235 lb) [id number] surface area is 2.21 meters squared. Body mass index is 39.11 kg/m.  HEENT: Pupils equally reactive to light and accomodation  Neck: Supple without thyromegaly, carotid pulses 2+ Lungs: clear to auscultation bilaterally; no wheezes, rales, rhonchi Heart: Regular rate  and rhythm. No gallops, murmurs or rub Abdomen: soft nontender, nondistended, with normal bowel sounds Extremities: no cyanosis, clubbing, or edema Peripheral Pulses: 2+ in all extremities, 2+ femoral pulses bilaterally  Assessment   78 y.o. male with  1. Atrial fibrillation, unspecified type (CMS-HCC)  2. SOB (shortness of breath)  3. Bradycardia  4. Gastroesophageal reflux disease, esophagitis presence not specified  5. Hyperlipidemia, unspecified hyperlipidemia type  6. OSA (obstructive sleep apnea)  7. DOE (dyspnea on exertion)  8. Mild obesity, unspecified  9. Class 2 severe obesity due to excess calories with serious comorbidity and body mass index (BMI) of 39.0 to 39.9 in adult (CMS-HCC)   Plan  1 obesity modest recommend significant weight loss exercise portion control 2 atrial fibrillation paroxysmal currently on Xarelto for anticoagulation 3 diabetes type 2 on glimepiride follow-up diet and exercise 4 GERD continue omeprazole therapy for reflux symptoms 5 hypertension continue HCTZ to help with blood pressure management 6 BPH patient on tamsulosin as well sildenafil 7 gout continue allopurinol daily to help with suppressive therapy 8 have the patient follow-up in 63-month  Return in about 6 months (around 06/28/2019).  Corey Prince Rome, MD  This dictation was prepared with dragon dictation. Any transcription errors that result from this process are unintentional.   Electronically signed by Corey Gibbon, MD at 12/31/2018 12:25 PM EDT

## 2019-03-15 DIAGNOSIS — M9901 Segmental and somatic dysfunction of cervical region: Secondary | ICD-10-CM | POA: Diagnosis not present

## 2019-03-15 DIAGNOSIS — M5412 Radiculopathy, cervical region: Secondary | ICD-10-CM | POA: Diagnosis not present

## 2019-03-15 DIAGNOSIS — M25561 Pain in right knee: Secondary | ICD-10-CM | POA: Diagnosis not present

## 2019-03-19 ENCOUNTER — Other Ambulatory Visit: Payer: Self-pay

## 2019-03-19 ENCOUNTER — Other Ambulatory Visit
Admission: RE | Admit: 2019-03-19 | Discharge: 2019-03-19 | Disposition: A | Discharge status: Home / Self Care | Payer: PPO | Source: Ambulatory Visit | Attending: Surgery | Admitting: Surgery

## 2019-03-19 DIAGNOSIS — Z01812 Encounter for preprocedural laboratory examination: Secondary | ICD-10-CM | POA: Diagnosis not present

## 2019-03-19 DIAGNOSIS — Z20828 Contact with and (suspected) exposure to other viral communicable diseases: Secondary | ICD-10-CM | POA: Diagnosis not present

## 2019-03-19 LAB — SARS CORONAVIRUS 2 (TAT 6-24 HRS): SARS Coronavirus 2: NEGATIVE

## 2019-03-20 DIAGNOSIS — M5412 Radiculopathy, cervical region: Secondary | ICD-10-CM | POA: Diagnosis not present

## 2019-03-20 DIAGNOSIS — M9901 Segmental and somatic dysfunction of cervical region: Secondary | ICD-10-CM | POA: Diagnosis not present

## 2019-03-20 DIAGNOSIS — M25561 Pain in right knee: Secondary | ICD-10-CM | POA: Diagnosis not present

## 2019-03-21 MED ORDER — CEFAZOLIN SODIUM-DEXTROSE 2-4 GM/100ML-% IV SOLN
2.0000 g | INTRAVENOUS | Status: AC
  Administered 2019-03-22: 2 g via INTRAVENOUS

## 2019-03-22 ENCOUNTER — Observation Stay
Admission: RE | Admit: 2019-03-22 | Discharge: 2019-03-23 | Disposition: A | Discharge status: Home / Self Care | Payer: PPO | Attending: Surgery | Admitting: Surgery

## 2019-03-22 ENCOUNTER — Encounter
Admission: RE | Disposition: A | Discharge status: Home / Self Care | Payer: Self-pay | Source: Home / Self Care | Attending: Surgery

## 2019-03-22 ENCOUNTER — Encounter: Payer: Self-pay | Admitting: *Deleted

## 2019-03-22 ENCOUNTER — Ambulatory Visit: Payer: PPO | Admitting: Anesthesiology

## 2019-03-22 ENCOUNTER — Other Ambulatory Visit: Payer: Self-pay

## 2019-03-22 DIAGNOSIS — E119 Type 2 diabetes mellitus without complications: Secondary | ICD-10-CM | POA: Insufficient documentation

## 2019-03-22 DIAGNOSIS — E669 Obesity, unspecified: Secondary | ICD-10-CM | POA: Diagnosis not present

## 2019-03-22 DIAGNOSIS — G4733 Obstructive sleep apnea (adult) (pediatric): Secondary | ICD-10-CM | POA: Insufficient documentation

## 2019-03-22 DIAGNOSIS — Z85828 Personal history of other malignant neoplasm of skin: Secondary | ICD-10-CM | POA: Insufficient documentation

## 2019-03-22 DIAGNOSIS — I4891 Unspecified atrial fibrillation: Secondary | ICD-10-CM | POA: Insufficient documentation

## 2019-03-22 DIAGNOSIS — R7989 Other specified abnormal findings of blood chemistry: Secondary | ICD-10-CM | POA: Diagnosis not present

## 2019-03-22 DIAGNOSIS — K8010 Calculus of gallbladder with chronic cholecystitis without obstruction: Secondary | ICD-10-CM | POA: Diagnosis not present

## 2019-03-22 DIAGNOSIS — N529 Male erectile dysfunction, unspecified: Secondary | ICD-10-CM | POA: Insufficient documentation

## 2019-03-22 DIAGNOSIS — K811 Chronic cholecystitis: Secondary | ICD-10-CM

## 2019-03-22 DIAGNOSIS — N400 Enlarged prostate without lower urinary tract symptoms: Secondary | ICD-10-CM | POA: Diagnosis not present

## 2019-03-22 DIAGNOSIS — I272 Pulmonary hypertension, unspecified: Secondary | ICD-10-CM | POA: Insufficient documentation

## 2019-03-22 DIAGNOSIS — K8020 Calculus of gallbladder without cholecystitis without obstruction: Secondary | ICD-10-CM | POA: Diagnosis not present

## 2019-03-22 DIAGNOSIS — Z7901 Long term (current) use of anticoagulants: Secondary | ICD-10-CM | POA: Insufficient documentation

## 2019-03-22 DIAGNOSIS — Z6836 Body mass index (BMI) 36.0-36.9, adult: Secondary | ICD-10-CM | POA: Insufficient documentation

## 2019-03-22 DIAGNOSIS — K219 Gastro-esophageal reflux disease without esophagitis: Secondary | ICD-10-CM | POA: Insufficient documentation

## 2019-03-22 DIAGNOSIS — Z9119 Patient's noncompliance with other medical treatment and regimen: Secondary | ICD-10-CM | POA: Diagnosis not present

## 2019-03-22 DIAGNOSIS — E785 Hyperlipidemia, unspecified: Secondary | ICD-10-CM | POA: Diagnosis not present

## 2019-03-22 DIAGNOSIS — Z79899 Other long term (current) drug therapy: Secondary | ICD-10-CM | POA: Diagnosis not present

## 2019-03-22 DIAGNOSIS — Z7984 Long term (current) use of oral hypoglycemic drugs: Secondary | ICD-10-CM | POA: Diagnosis not present

## 2019-03-22 DIAGNOSIS — Z23 Encounter for immunization: Secondary | ICD-10-CM | POA: Diagnosis not present

## 2019-03-22 LAB — GLUCOSE, CAPILLARY
Glucose-Capillary: 126 mg/dL — ABNORMAL HIGH (ref 70–99)
Glucose-Capillary: 189 mg/dL — ABNORMAL HIGH (ref 70–99)

## 2019-03-22 SURGERY — CHOLECYSTECTOMY, ROBOT-ASSISTED, LAPAROSCOPIC
Anesthesia: General

## 2019-03-22 MED ORDER — LIDOCAINE HCL (PF) 2 % IJ SOLN
INTRAMUSCULAR | Status: AC
  Filled 2019-03-22: qty 10

## 2019-03-22 MED ORDER — ACETAMINOPHEN 500 MG PO TABS
1000.0000 mg | ORAL_TABLET | ORAL | Status: AC
  Administered 2019-03-22: 09:00:00 1000 mg via ORAL

## 2019-03-22 MED ORDER — SUCCINYLCHOLINE CHLORIDE 20 MG/ML IJ SOLN
INTRAMUSCULAR | Status: DC | PRN
  Administered 2019-03-22: 120 mg via INTRAVENOUS

## 2019-03-22 MED ORDER — LACTATED RINGERS IV SOLN: INTRAVENOUS | Status: DC

## 2019-03-22 MED ORDER — PANTOPRAZOLE SODIUM 40 MG PO TBEC
40.0000 mg | DELAYED_RELEASE_TABLET | Freq: Every day | ORAL | Status: DC
  Administered 2019-03-23: 40 mg via ORAL
  Filled 2019-03-22: qty 1

## 2019-03-22 MED ORDER — SUGAMMADEX SODIUM 200 MG/2ML IV SOLN
INTRAVENOUS | Status: DC | PRN
  Administered 2019-03-22: 200 mg via INTRAVENOUS

## 2019-03-22 MED ORDER — ONDANSETRON HCL 4 MG/2ML IJ SOLN: 4.0000 mg | Freq: Four times a day (QID) | INTRAMUSCULAR | Status: DC | PRN

## 2019-03-22 MED ORDER — ALUM & MAG HYDROXIDE-SIMETH 200-200-20 MG/5ML PO SUSP
30.0000 mL | ORAL | Status: DC | PRN
  Administered 2019-03-22: 21:00:00 30 mL via ORAL
  Filled 2019-03-22: qty 30

## 2019-03-22 MED ORDER — ONDANSETRON HCL 4 MG/2ML IJ SOLN: 4.0000 mg | Freq: Once | INTRAMUSCULAR | Status: DC | PRN

## 2019-03-22 MED ORDER — DEXAMETHASONE SODIUM PHOSPHATE 10 MG/ML IJ SOLN
INTRAMUSCULAR | Status: DC | PRN
  Administered 2019-03-22: 10 mg via INTRAVENOUS

## 2019-03-22 MED ORDER — MIDAZOLAM HCL 2 MG/2ML IJ SOLN
INTRAMUSCULAR | Status: AC
  Filled 2019-03-22: qty 2

## 2019-03-22 MED ORDER — TAMSULOSIN HCL 0.4 MG PO CAPS
0.4000 mg | ORAL_CAPSULE | Freq: Every day | ORAL | Status: DC
  Administered 2019-03-22: 19:00:00 0.4 mg via ORAL
  Filled 2019-03-22: qty 1

## 2019-03-22 MED ORDER — BUPIVACAINE HCL (PF) 0.5 % IJ SOLN
INTRAMUSCULAR | Status: DC | PRN
  Administered 2019-03-22: 10 mL

## 2019-03-22 MED ORDER — HYDROMORPHONE HCL 1 MG/ML IJ SOLN
INTRAMUSCULAR | Status: AC
  Filled 2019-03-22: qty 1

## 2019-03-22 MED ORDER — ROCURONIUM BROMIDE 50 MG/5ML IV SOLN
INTRAVENOUS | Status: AC
  Filled 2019-03-22: qty 1

## 2019-03-22 MED ORDER — SODIUM CHLORIDE FLUSH 0.9 % IV SOLN
INTRAVENOUS | Status: AC
  Filled 2019-03-22: qty 10

## 2019-03-22 MED ORDER — PHENYLEPHRINE HCL (PRESSORS) 10 MG/ML IV SOLN
INTRAVENOUS | Status: DC | PRN
  Administered 2019-03-22 (×2): 100 ug via INTRAVENOUS

## 2019-03-22 MED ORDER — FENTANYL CITRATE (PF) 100 MCG/2ML IJ SOLN
INTRAMUSCULAR | Status: AC
  Filled 2019-03-22: qty 2

## 2019-03-22 MED ORDER — ALLOPURINOL 100 MG PO TABS
100.0000 mg | ORAL_TABLET | Freq: Every day | ORAL | Status: DC
  Administered 2019-03-22: 19:00:00 100 mg via ORAL
  Filled 2019-03-22: qty 1

## 2019-03-22 MED ORDER — BUPIVACAINE HCL (PF) 0.5 % IJ SOLN
INTRAMUSCULAR | Status: AC
  Filled 2019-03-22: qty 30

## 2019-03-22 MED ORDER — CHLORHEXIDINE GLUCONATE CLOTH 2 % EX PADS: 6.0000 | MEDICATED_PAD | Freq: Once | CUTANEOUS | Status: DC

## 2019-03-22 MED ORDER — GABAPENTIN 300 MG PO CAPS
300.0000 mg | ORAL_CAPSULE | ORAL | Status: AC
  Administered 2019-03-22: 09:00:00 300 mg via ORAL

## 2019-03-22 MED ORDER — INFLUENZA VAC A&B SA ADJ QUAD 0.5 ML IM PRSY
0.5000 mL | PREFILLED_SYRINGE | INTRAMUSCULAR | Status: AC
  Administered 2019-03-23: 09:00:00 0.5 mL via INTRAMUSCULAR
  Filled 2019-03-22 (×2): qty 0.5

## 2019-03-22 MED ORDER — HYDROCODONE-ACETAMINOPHEN 5-325 MG PO TABS: 1.0000 | ORAL_TABLET | ORAL | Status: DC | PRN

## 2019-03-22 MED ORDER — LIDOCAINE-EPINEPHRINE (PF) 1 %-1:200000 IJ SOLN
INTRAMUSCULAR | Status: AC
  Filled 2019-03-22: qty 30

## 2019-03-22 MED ORDER — ROCURONIUM BROMIDE 100 MG/10ML IV SOLN
INTRAVENOUS | Status: DC | PRN
  Administered 2019-03-22: 10 mg via INTRAVENOUS
  Administered 2019-03-22: 30 mg via INTRAVENOUS
  Administered 2019-03-22: 20 mg via INTRAVENOUS
  Administered 2019-03-22: 10 mg via INTRAVENOUS
  Administered 2019-03-22: 20 mg via INTRAVENOUS

## 2019-03-22 MED ORDER — SODIUM CHLORIDE 0.9 % IV SOLN
INTRAVENOUS | Status: DC
  Administered 2019-03-22: 09:00:00 via INTRAVENOUS

## 2019-03-22 MED ORDER — CEFAZOLIN SODIUM-DEXTROSE 2-4 GM/100ML-% IV SOLN
INTRAVENOUS | Status: AC
  Filled 2019-03-22: qty 100

## 2019-03-22 MED ORDER — GLIMEPIRIDE 2 MG PO TABS
2.0000 mg | ORAL_TABLET | Freq: Every day | ORAL | Status: DC
  Administered 2019-03-23: 2 mg via ORAL
  Filled 2019-03-22: qty 1

## 2019-03-22 MED ORDER — FENTANYL CITRATE (PF) 100 MCG/2ML IJ SOLN
INTRAMUSCULAR | Status: DC | PRN
  Administered 2019-03-22 (×4): 50 ug via INTRAVENOUS

## 2019-03-22 MED ORDER — HYDROMORPHONE HCL 1 MG/ML IJ SOLN
0.5000 mg | INTRAMUSCULAR | Status: DC | PRN
  Administered 2019-03-22 (×2): 0.5 mg via INTRAVENOUS

## 2019-03-22 MED ORDER — LORATADINE 10 MG PO TABS
10.0000 mg | ORAL_TABLET | Freq: Every day | ORAL | Status: DC
  Administered 2019-03-22 – 2019-03-23 (×2): 10 mg via ORAL
  Filled 2019-03-22 (×2): qty 1

## 2019-03-22 MED ORDER — FENTANYL CITRATE (PF) 100 MCG/2ML IJ SOLN: 25.0000 ug | INTRAMUSCULAR | Status: DC | PRN

## 2019-03-22 MED ORDER — PROPOFOL 10 MG/ML IV BOLUS
INTRAVENOUS | Status: DC | PRN
  Administered 2019-03-22: 140 mg via INTRAVENOUS

## 2019-03-22 MED ORDER — ONDANSETRON 4 MG PO TBDP: 4.0000 mg | ORAL_TABLET | Freq: Four times a day (QID) | ORAL | Status: DC | PRN

## 2019-03-22 MED ORDER — HYDROMORPHONE HCL 1 MG/ML IJ SOLN
INTRAMUSCULAR | Status: DC | PRN
  Administered 2019-03-22: .6 mg via INTRAVENOUS
  Administered 2019-03-22: .4 mg via INTRAVENOUS

## 2019-03-22 MED ORDER — ACETAMINOPHEN 325 MG PO TABS
650.0000 mg | ORAL_TABLET | Freq: Three times a day (TID) | ORAL | Status: DC | PRN
  Administered 2019-03-23: 02:00:00 650 mg via ORAL
  Filled 2019-03-22: qty 2

## 2019-03-22 MED ORDER — LIDOCAINE-EPINEPHRINE (PF) 1 %-1:200000 IJ SOLN
INTRAMUSCULAR | Status: DC | PRN
  Administered 2019-03-22: 10 mL

## 2019-03-22 MED ORDER — EPHEDRINE SULFATE 50 MG/ML IJ SOLN
INTRAMUSCULAR | Status: AC
  Filled 2019-03-22: qty 1

## 2019-03-22 MED ORDER — PNEUMOCOCCAL VAC POLYVALENT 25 MCG/0.5ML IJ INJ
0.5000 mL | INJECTION | INTRAMUSCULAR | Status: AC
  Administered 2019-03-23: 09:00:00 0.5 mL via INTRAMUSCULAR
  Filled 2019-03-22: qty 0.5

## 2019-03-22 MED ORDER — SEVOFLURANE IN SOLN
RESPIRATORY_TRACT | Status: AC
  Filled 2019-03-22: qty 250

## 2019-03-22 MED ORDER — HYDROCHLOROTHIAZIDE 25 MG PO TABS
25.0000 mg | ORAL_TABLET | Freq: Every day | ORAL | Status: DC
  Administered 2019-03-22: 19:00:00 25 mg via ORAL
  Filled 2019-03-22: qty 1

## 2019-03-22 MED ORDER — ACETAMINOPHEN 500 MG PO TABS
ORAL_TABLET | ORAL | Status: AC
  Administered 2019-03-22: 09:00:00 1000 mg via ORAL
  Filled 2019-03-22: qty 2

## 2019-03-22 MED ORDER — MORPHINE SULFATE (PF) 2 MG/ML IV SOLN: 1.0000 mg | INTRAVENOUS | Status: DC | PRN

## 2019-03-22 MED ORDER — MIDAZOLAM HCL 2 MG/2ML IJ SOLN
1.0000 mg | Freq: Once | INTRAMUSCULAR | Status: AC
  Administered 2019-03-22: 14:00:00 1 mg via INTRAVENOUS

## 2019-03-22 MED ORDER — SUCCINYLCHOLINE CHLORIDE 20 MG/ML IJ SOLN
INTRAMUSCULAR | Status: AC
  Filled 2019-03-22: qty 1

## 2019-03-22 MED ORDER — GABAPENTIN 300 MG PO CAPS
ORAL_CAPSULE | ORAL | Status: AC
  Administered 2019-03-22: 300 mg via ORAL
  Filled 2019-03-22: qty 1

## 2019-03-22 MED ORDER — LACTATED RINGERS IV SOLN
INTRAVENOUS | Status: DC | PRN
  Administered 2019-03-22: 11:00:00 via INTRAVENOUS

## 2019-03-22 MED ORDER — CELECOXIB 200 MG PO CAPS: 200.0000 mg | ORAL_CAPSULE | ORAL | Status: DC

## 2019-03-22 MED ORDER — MIDAZOLAM HCL 2 MG/2ML IJ SOLN
INTRAMUSCULAR | Status: DC | PRN
  Administered 2019-03-22 (×2): 1 mg via INTRAVENOUS

## 2019-03-22 MED ORDER — PROPOFOL 10 MG/ML IV BOLUS
INTRAVENOUS | Status: AC
  Filled 2019-03-22: qty 20

## 2019-03-22 SURGICAL SUPPLY — 62 items
ANCHOR TIS RET SYS 235ML (MISCELLANEOUS) ×3 IMPLANT
BAG PRESSURE INFUS OVAL 1000CC (MISCELLANEOUS) ×3 IMPLANT
BLADE SURG SZ11 CARB STEEL (BLADE) ×3 IMPLANT
BULB RESERV EVAC DRAIN JP 100C (MISCELLANEOUS) ×3 IMPLANT
CANISTER SUCT 1200ML W/VALVE (MISCELLANEOUS) ×3 IMPLANT
CANNULA REDUC XI 12-8 STAPL (CANNULA) ×1
CANNULA REDUC XI 12-8MM STAPL (CANNULA) ×1
CANNULA REDUCER 12-8 DVNC XI (CANNULA) ×1 IMPLANT
CHLORAPREP W/TINT 26 (MISCELLANEOUS) ×3 IMPLANT
CLIP VESOLOCK MED LG 6/CT (CLIP) ×3 IMPLANT
COVER TIP SHEARS 8 DVNC (MISCELLANEOUS) ×1 IMPLANT
COVER TIP SHEARS 8MM DA VINCI (MISCELLANEOUS) ×2
COVER WAND RF STERILE (DRAPES) ×3 IMPLANT
DECANTER SPIKE VIAL GLASS SM (MISCELLANEOUS) ×6 IMPLANT
DEFOGGER SCOPE WARMER CLEARIFY (MISCELLANEOUS) ×3 IMPLANT
DERMABOND ADVANCED (GAUZE/BANDAGES/DRESSINGS) ×2
DERMABOND ADVANCED .7 DNX12 (GAUZE/BANDAGES/DRESSINGS) ×1 IMPLANT
DRAIN CHANNEL JP 15F RND 16 (MISCELLANEOUS) ×3 IMPLANT
DRAPE 3/4 80X56 (DRAPES) ×3 IMPLANT
DRAPE ARM DVNC X/XI (DISPOSABLE) ×4 IMPLANT
DRAPE COLUMN DVNC XI (DISPOSABLE) ×1 IMPLANT
DRAPE DA VINCI XI ARM (DISPOSABLE) ×8
DRAPE DA VINCI XI COLUMN (DISPOSABLE) ×2
ELECT REM PT RETURN 9FT ADLT (ELECTROSURGICAL) ×3
ELECTRODE REM PT RTRN 9FT ADLT (ELECTROSURGICAL) ×1 IMPLANT
GLOVE BIOGEL PI IND STRL 7.0 (GLOVE) ×6 IMPLANT
GLOVE BIOGEL PI INDICATOR 7.0 (GLOVE) ×12
GLOVE SURG SYN 6.5 ES PF (GLOVE) ×12 IMPLANT
GOWN STRL REUS W/ TWL LRG LVL3 (GOWN DISPOSABLE) ×4 IMPLANT
GOWN STRL REUS W/TWL LRG LVL3 (GOWN DISPOSABLE) ×8
GRASPER SUT TROCAR 14GX15 (MISCELLANEOUS) ×3 IMPLANT
IRRIGATOR SUCT 8 DISP DVNC XI (IRRIGATION / IRRIGATOR) ×1 IMPLANT
IRRIGATOR SUCTION 8MM XI DISP (IRRIGATION / IRRIGATOR) ×2
IV NS 1000ML (IV SOLUTION)
IV NS 1000ML BAXH (IV SOLUTION) IMPLANT
KIT PINK PAD W/HEAD ARE REST (MISCELLANEOUS) ×3
KIT PINK PAD W/HEAD ARM REST (MISCELLANEOUS) ×1 IMPLANT
LABEL OR SOLS (LABEL) ×3 IMPLANT
NEEDLE HYPO 22GX1.5 SAFETY (NEEDLE) ×3 IMPLANT
NEEDLE VERESS 14GA 120MM (NEEDLE) ×3 IMPLANT
NS IRRIG 500ML POUR BTL (IV SOLUTION) ×3 IMPLANT
OBTURATOR OPTICAL STANDARD 8MM (TROCAR) ×2
OBTURATOR OPTICAL STND 8 DVNC (TROCAR) ×1
OBTURATOR OPTICALSTD 8 DVNC (TROCAR) ×1 IMPLANT
PACK LAP CHOLECYSTECTOMY (MISCELLANEOUS) ×3 IMPLANT
SEAL CANN UNIV 5-8 DVNC XI (MISCELLANEOUS) ×4 IMPLANT
SEAL XI 5MM-8MM UNIVERSAL (MISCELLANEOUS) ×8
SOLUTION ELECTROLUBE (MISCELLANEOUS) ×3 IMPLANT
SPONGE DRAIN TRACH 4X4 STRL 2S (GAUZE/BANDAGES/DRESSINGS) ×3 IMPLANT
STAPLER CANNULA SEAL DVNC XI (STAPLE) ×1 IMPLANT
STAPLER CANNULA SEAL XI (STAPLE) ×2
SUT ETHILON 3-0 FS-10 30 BLK (SUTURE) ×3
SUT MNCRL 4-0 (SUTURE) ×2
SUT MNCRL 4-0 27XMFL (SUTURE) ×1
SUT VIC AB 3-0 SH 27 (SUTURE) ×2
SUT VIC AB 3-0 SH 27X BRD (SUTURE) ×1 IMPLANT
SUT VICRYL 0 AB UR-6 (SUTURE) ×3 IMPLANT
SUTURE EHLN 3-0 FS-10 30 BLK (SUTURE) ×1 IMPLANT
SUTURE MNCRL 4-0 27XMF (SUTURE) ×1 IMPLANT
SYR 20ML LL LF (SYRINGE) ×3 IMPLANT
TROCAR XCEL NON-BLD 5MMX100MML (ENDOMECHANICALS) ×3 IMPLANT
TUBING EVAC SMOKE HEATED PNEUM (TUBING) ×3 IMPLANT

## 2019-03-22 NOTE — Anesthesia Postprocedure Evaluation (Signed)
Anesthesia Post Note  Patient: Corey [name]  Procedure(s) Performed: XI ROBOTIC ASSISTED LAPAROSCOPIC CHOLECYSTECTOMY, DIABETIC TYPE 2 (N/A )  Patient location during evaluation: PACU Anesthesia Type: General Level of consciousness: awake Pain management: pain level controlled Respiratory status: spontaneous breathing Cardiovascular status: blood pressure returned to baseline Anesthetic complications: no Comments: Patient initially combative , but settled down with meds     Last Vitals:  Vitals:   03/22/19 1430 03/22/19 1445  BP: 126/66 122/79  Pulse: 80 80  Resp: 19 19  Temp:    SpO2: 90% 90%    Last Pain:  Vitals:   03/22/19 1445  TempSrc:   PainSc: Asleep                 Ilyse Tremain

## 2019-03-22 NOTE — Anesthesia Preprocedure Evaluation (Signed)
Anesthesia Evaluation  Patient identified by MRN, date of birth, ID band Patient awake    Reviewed: Allergy & Precautions, H&P , NPO status , Patient's Chart, lab work & pertinent test results, reviewed documented beta blocker date and time   History of Anesthesia Complications Negative for: history of anesthetic complications  Airway Mallampati: I  TM Distance: >3 FB Neck ROM: full    Dental  (+) Dental Advidsory Given, Missing, Teeth Intact   Pulmonary neg shortness of breath, sleep apnea , neg COPD, neg recent URI,           Cardiovascular Exercise Tolerance: Good (-) hypertension(-) angina(-) CAD, (-) Past MI, (-) Cardiac Stents and (-) CABG + dysrhythmias Atrial Fibrillation (-) Valvular Problems/Murmurs     Neuro/Psych negative neurological ROS  negative psych ROS   GI/Hepatic Neg liver ROS, hiatal hernia, PUD, GERD  ,  Endo/Other  diabetes  Renal/GU Renal disease (kidney stones)  negative genitourinary   Musculoskeletal   Abdominal   Peds  Hematology negative hematology ROS (+)   Anesthesia Other Findings Past Medical History: No date: Arthritis     Comment:  OSTEOARTHRITIS No date: BPH (benign prostatic hyperplasia) No date: Cancer ([place])     Comment:  SKIN  CANCER FROM BACK No date: Diabetes mellitus without complication (HCC) No date: Dysphagia No date: Dysrhythmia     Comment:  A fib No date: ED (erectile dysfunction) No date: Environmental allergies No date: GERD (gastroesophageal reflux disease) No date: Glaucoma (increased eye pressure) No date: History of chicken pox No date: History of colonic polyps No date: History of kidney stones No date: Hyperlipidemia No date: Multiple gastric ulcers No date: Obesity No date: Sleep apnea   Reproductive/Obstetrics negative OB ROS                             Anesthesia Physical  Anesthesia Plan  ASA: III  Anesthesia  Plan: General   Post-op Pain Management:    Induction: Intravenous  PONV Risk Score and Plan: 2 and Propofol infusion  Airway Management Planned: Oral ETT  Additional Equipment:   Intra-op Plan:   Post-operative Plan: Extubation in OR  Informed Consent: I have reviewed the patients History and Physical, chart, labs and discussed the procedure including the risks, benefits and alternatives for the proposed anesthesia with the patient or authorized representative who has indicated his/her understanding and acceptance.     Dental Advisory Given  Plan Discussed with: Anesthesiologist, CRNA and Surgeon  Anesthesia Plan Comments:         Anesthesia Quick Evaluation

## 2019-03-22 NOTE — Anesthesia Procedure Notes (Addendum)
Procedure Name: Intubation Date/Time: 03/22/2019 10:46 AM Performed by: Justus Memory, CRNA Pre-anesthesia Checklist: Patient identified, Patient being monitored, Timeout performed, Emergency Drugs available and Suction available Patient Re-evaluated:Patient Re-evaluated prior to induction Oxygen Delivery Method: Circle system utilized Preoxygenation: Pre-oxygenation with 100% oxygen Induction Type: IV induction Ventilation: Mask ventilation without difficulty Laryngoscope Size: Mac, 3 and McGraph Grade View: Grade I Tube type: Oral Tube size: 7.0 mm Number of attempts: 1 Airway Equipment and Method: Stylet and Video-laryngoscopy Placement Confirmation: ETT inserted through vocal cords under direct vision,  positive ETCO2 and breath sounds checked- equal and bilateral Secured at: 22 cm Tube secured with: Tape Dental Injury: Injury to lip  Difficulty Due To: Difficulty was anticipated, Difficult Airway- due to anterior larynx and Difficult Airway- due to dentition Future Recommendations: Recommend- induction with short-acting agent, and alternative techniques readily available

## 2019-03-22 NOTE — Anesthesia Post-op Follow-up Note (Signed)
Anesthesia QCDR form completed.        

## 2019-03-22 NOTE — Transfer of Care (Signed)
Immediate Anesthesia Transfer of Care Note  Patient: Corey [name]  Procedure(s) Performed: XI ROBOTIC ASSISTED LAPAROSCOPIC CHOLECYSTECTOMY, DIABETIC TYPE 2 (N/A )  Patient Location: PACU  Anesthesia Type:General  Level of Consciousness: sedated  Airway & Oxygen Therapy: Patient Spontanous Breathing and Patient connected to face mask oxygen  Post-op Assessment: Report given to RN and Post -op Vital signs reviewed and stable  Post vital signs: Reviewed and stable  Last Vitals:  Vitals Value Taken Time  BP 146/72 03/22/19 1358  Temp    Pulse 104 03/22/19 1400  Resp 19 03/22/19 1400  SpO2 96 % 03/22/19 1400  Vitals shown include unvalidated device data.  Last Pain:  Vitals:   03/22/19 0854  TempSrc: Temporal  PainSc: 0-No pain         Complications: No apparent anesthesia complications

## 2019-03-22 NOTE — Interval H&P Note (Signed)
History and Physical Interval Note:  03/22/2019 8:50 AM  Corey [name]  has presented today for surgery, with the diagnosis of R79.89 ELEVATED LFT K80.20 GALLSTONES.  The various methods of treatment have been discussed with the patient and family. After consideration of risks, benefits and other options for treatment, the patient has consented to  Procedure(s): XI ROBOTIC ASSISTED LAPAROSCOPIC CHOLECYSTECTOMY, DIABETIC TYPE 2 (N/A) as a surgical intervention.  The patient's history has been reviewed, patient examined, no change in status, stable for surgery.  I have reviewed the patient's chart and labs.  Questions were answered to the patient's satisfaction.     Levin Dagostino Lysle Pearl

## 2019-03-23 DIAGNOSIS — K8010 Calculus of gallbladder with chronic cholecystitis without obstruction: Secondary | ICD-10-CM | POA: Diagnosis not present

## 2019-03-23 LAB — CBC
HCT: 47.5 % (ref 39.0–52.0)
Hemoglobin: 15.7 g/dL (ref 13.0–17.0)
MCH: 29.5 pg (ref 26.0–34.0)
MCHC: 33.1 g/dL (ref 30.0–36.0)
MCV: 89.1 fL (ref 80.0–100.0)
Platelets: 227 10*3/uL (ref 150–400)
RBC: 5.33 MIL/uL (ref 4.22–5.81)
RDW: 14 % (ref 11.5–15.5)
WBC: 19.2 10*3/uL — ABNORMAL HIGH (ref 4.0–10.5)
nRBC: 0 % (ref 0.0–0.2)

## 2019-03-23 LAB — BASIC METABOLIC PANEL
Anion gap: 14 (ref 5–15)
BUN: 16 mg/dL (ref 8–23)
CO2: 25 mmol/L (ref 22–32)
Calcium: 9.2 mg/dL (ref 8.9–10.3)
Chloride: 99 mmol/L (ref 98–111)
Creatinine, Ser: 0.89 mg/dL (ref 0.61–1.24)
GFR calc Af Amer: >60 mL/min (ref >60)
GFR calc non Af Amer: >60 mL/min (ref >60)
Glucose, Bld: 172 mg/dL — ABNORMAL HIGH (ref 70–99)
Potassium: 3.7 mmol/L (ref 3.5–5.1)
Sodium: 138 mmol/L (ref 135–145)

## 2019-03-23 MED ORDER — ACETAMINOPHEN 325 MG PO TABS: 650.0000 mg | ORAL_TABLET | Freq: Three times a day (TID) | ORAL | 0 refills | Status: AC | PRN

## 2019-03-23 MED ORDER — DOCUSATE SODIUM 100 MG PO CAPS: 100.0000 mg | ORAL_CAPSULE | Freq: Two times a day (BID) | ORAL | 0 refills | Status: AC | PRN

## 2019-03-23 MED ORDER — IBUPROFEN 800 MG PO TABS: 800.0000 mg | ORAL_TABLET | Freq: Three times a day (TID) | ORAL | 0 refills | Status: DC | PRN

## 2019-03-23 MED ORDER — HYDROCODONE-ACETAMINOPHEN 5-325 MG PO TABS: 1.0000 | ORAL_TABLET | Freq: Four times a day (QID) | ORAL | 0 refills | Status: AC | PRN

## 2019-03-23 NOTE — Plan of Care (Signed)

## 2019-03-23 NOTE — Discharge Instructions (Signed)
Laparoscopic Cholecystectomy, Care After This sheet gives you information about how to care for yourself after your procedure. Your doctor may also give you more specific instructions. If you have problems or questions, contact your doctor. Follow these instructions at home: Care for cuts from surgery (incisions)   Follow instructions from your doctor about how to take care of your cuts from surgery. Make sure you: ? Wash your hands with soap and water before you change your bandage (dressing). If you cannot use soap and water, use hand sanitizer. ? Change DRESSING AFTER 48HRS FROM SURGERY.  KEEP AREA COVERED UNTIL WOUND HEALS ON ITS OWN.  NO NEED FOR ANY OINTMENTS.  Leave stitches (sutures), skin glue, or skin tape (adhesive) strips in place. They may need to stay in place for 2 weeks or longer. If tape strips get loose and curl up, you may trim the loose edges. Do not remove tape strips completely unless your doctor says it is okay.  Do not take baths, swim, or use a hot tub until your doctor says it is okay. OK TO SHOWER 24HRS AFTER YOUR SURGERY.   Check your surgical cut area every day for signs of infection. Check for: ? More redness, swelling, or pain. ? More fluid or blood. ? Warmth. ? Pus or a bad smell. Activity  Do not drive or use heavy machinery while taking prescription pain medicine.  Do not play contact sports until your doctor says it is okay.  Do not drive for 24 hours if you were given a medicine to help you relax (sedative).  Rest as needed. Do not return to work or school until your doctor says it is okay. General instructions   tylenol and advil as needed for discomfort.  Please alternate between the two every four hours as needed for pain.     Use narcotics, if prescribed, only when tylenol and motrin is not enough to control pain.   325-650mg  every 8hrs to max of 3000mg /24hrs (including the 325mg  in every norco dose) for the tylenol.     Advil up to 800mg  per  dose every 8hrs as needed for pain.   RESUME ELIQUIS 72HRS AFTER PROCEDURE   To prevent or treat constipation while you are taking prescription pain medicine, your doctor may recommend that you: ? Drink enough fluid to keep your pee (urine) clear or pale yellow. ? Take over-the-counter or prescription medicines. ? Eat foods that are high in fiber, such as fresh fruits and vegetables, whole grains, and beans. ? Limit foods that are high in fat and processed sugars, such as fried and sweet foods. Contact a doctor if:  You develop a rash.  You have more redness, swelling, or pain around your surgical cuts.  You have more fluid or blood coming from your surgical cuts.  Your surgical cuts feel warm to the touch.  You have pus or a bad smell coming from your surgical cuts.  You have a fever.  One or more of your surgical cuts breaks open. Get help right away if:  You have trouble breathing.  You have chest pain.  You have pain that is getting worse in your shoulders.  You faint or feel dizzy when you stand.  You have very bad pain in your belly (abdomen).  You are sick to your stomach (nauseous) for more than one day.  You have throwing up (vomiting) that lasts for more than one day.  You have leg pain. This information is not intended to  replace advice given to you by your health care provider. Make sure you discuss any questions you have with your health care provider. Document Released: 04/05/2008 Document Revised: 01/16/2016 Document Reviewed: 12/14/2015 Elsevier Interactive Patient Education  2019 [employer].

## 2019-03-23 NOTE — Plan of Care (Signed)
  Problem: Education: Goal: Knowledge of General Education information will improve Description: Including pain rating scale, medication(s)/side effects and non-pharmacologic comfort measures 03/23/2019 0759 by Carla Drape, RN Outcome: Adequate for Discharge 03/23/2019 0746 by Carla Drape, RN Outcome: Progressing   Problem: Health Behavior/Discharge Planning: Goal: Ability to manage health-related needs will improve 03/23/2019 0759 by Carla Drape, RN Outcome: Adequate for Discharge 03/23/2019 0746 by Carla Drape, RN Outcome: Progressing   Problem: Clinical Measurements: Goal: Ability to maintain clinical measurements within normal limits will improve 03/23/2019 0759 by Carla Drape, RN Outcome: Adequate for Discharge 03/23/2019 0746 by Carla Drape, RN Outcome: Progressing Goal: Will remain free from infection 03/23/2019 0759 by Carla Drape, RN Outcome: Adequate for Discharge 03/23/2019 0746 by Carla Drape, RN Outcome: Progressing Goal: Diagnostic test results will improve 03/23/2019 0759 by Carla Drape, RN Outcome: Adequate for Discharge 03/23/2019 0746 by Carla Drape, RN Outcome: Progressing Goal: Respiratory complications will improve 03/23/2019 0759 by Carla Drape, RN Outcome: Adequate for Discharge 03/23/2019 0746 by Carla Drape, RN Outcome: Progressing Goal: Cardiovascular complication will be avoided 03/23/2019 0759 by Carla Drape, RN Outcome: Adequate for Discharge 03/23/2019 0746 by Carla Drape, RN Outcome: Progressing   Problem: Activity: Goal: Risk for activity intolerance will decrease 03/23/2019 0759 by Carla Drape, RN Outcome: Adequate for Discharge 03/23/2019 0746 by Carla Drape, RN Outcome: Progressing   Problem: Nutrition: Goal: Adequate nutrition will be maintained 03/23/2019 0759 by Carla Drape, RN Outcome: Adequate for Discharge 03/23/2019 0746 by  Carla Drape, RN Outcome: Progressing   Problem: Coping: Goal: Level of anxiety will decrease 03/23/2019 0759 by Carla Drape, RN Outcome: Adequate for Discharge 03/23/2019 0746 by Carla Drape, RN Outcome: Progressing   Problem: Elimination: Goal: Will not experience complications related to bowel motility 03/23/2019 0759 by Carla Drape, RN Outcome: Adequate for Discharge 03/23/2019 0746 by Carla Drape, RN Outcome: Progressing Goal: Will not experience complications related to urinary retention 03/23/2019 0759 by Carla Drape, RN Outcome: Adequate for Discharge 03/23/2019 0746 by Carla Drape, RN Outcome: Progressing   Problem: Pain Managment: Goal: General experience of comfort will improve 03/23/2019 0759 by Carla Drape, RN Outcome: Adequate for Discharge 03/23/2019 0746 by Carla Drape, RN Outcome: Progressing   Problem: Safety: Goal: Ability to remain free from injury will improve 03/23/2019 0759 by Carla Drape, RN Outcome: Adequate for Discharge 03/23/2019 0746 by Carla Drape, RN Outcome: Progressing   Problem: Skin Integrity: Goal: Risk for impaired skin integrity will decrease 03/23/2019 0759 by Carla Drape, RN Outcome: Adequate for Discharge 03/23/2019 0746 by Carla Drape, RN Outcome: Progressing

## 2019-03-23 NOTE — Progress Notes (Signed)
Discharge process complete. Patient has no questions or concerns. PIVs removed, personal items gathered and taken with patient and pain under control. Vital signs stable. Wheelchair escort off unit by staff member. Patient is off unit. Care relinquished.

## 2019-03-23 NOTE — Discharge Summary (Signed)
Physician Discharge Summary  Patient ID: Corey [name] MRN: [id number] DOB/AGE: 02-14-1941 78 y.o.  Admit date: 03/22/2019 Discharge date: 03/23/2019  Admission Diagnoses: Chronic cholecystitis  Discharge Diagnoses:  Same as above  Discharged Condition: good  Hospital Course: Patient admitted for observation after a somewhat difficult lap chole we with concerns for continued bleeding at 1 of the port sites.  Pain control was somewhat difficult immediately after the surgery, but patient has calmed down very quickly afterwards.  During the initial stage in PACU, patient was confused and pulled out his JP drain as well.  At the time of admission, patient states pain is no issues, tolerated regular diet, and there is no active signs of bleeding on clinical exam.  Consults: None  Discharge Exam: Blood pressure 117/85, pulse 87, temperature 98 F (36.7 C), temperature source Oral, resp. rate 20, height 5\' 8"  (1.727 m), weight 109 kg, SpO2 96 %. General appearance: alert and no distress GI: soft, non-tender; bowel sounds normal; no masses,  no organomegaly and Incisions clean dry and intact  Disposition:  Discharge disposition: 01-Home or Self Care       Discharge Instructions    Discharge patient   Complete by: As directed    Discharge disposition: 01-Home or Self Care   Discharge patient date: 03/23/2019     Allergies as of 03/23/2019   No Known Allergies     Medication List    STOP taking these medications   acetaminophen 650 MG CR tablet Commonly known as: TYLENOL Replaced by: acetaminophen 325 MG tablet     TAKE these medications   acetaminophen 325 MG tablet Commonly known as: Tylenol Take 2 tablets (650 mg total) by mouth every 8 (eight) hours as needed for mild pain. Replaces: acetaminophen 650 MG CR tablet   allopurinol 100 MG tablet Commonly known as: ZYLOPRIM Take 100 mg by mouth daily at 3 pm.   cetirizine 10 MG tablet Commonly known as: ZYRTEC Take 10  mg by mouth daily at 3 pm.   docusate sodium 100 MG capsule Commonly known as: Colace Take 1 capsule (100 mg total) by mouth 2 (two) times daily as needed for up to 10 days for mild constipation.   glimepiride 2 MG tablet Commonly known as: AMARYL Take 2 mg by mouth daily with breakfast.   hydrochlorothiazide 25 MG tablet Commonly known as: HYDRODIURIL Take 25 mg by mouth daily at 3 pm.   HYDROcodone-acetaminophen 5-325 MG tablet Commonly known as: Norco Take 1 tablet by mouth every 6 (six) hours as needed for up to 3 days for moderate pain.   ibuprofen 800 MG tablet Commonly known as: ADVIL Take 1 tablet (800 mg total) by mouth every 8 (eight) hours as needed for mild pain or moderate pain. What changed:   medication strength  how much to take  when to take this  reasons to take this   omeprazole 20 MG capsule Commonly known as: PRILOSEC Take 20 mg by mouth daily before breakfast.   rivaroxaban 20 MG Tabs tablet Commonly known as: XARELTO Take 20 mg by mouth daily with breakfast. Notes to patient: RESUME AFTER 72HRS FROM SURGERY   tamsulosin 0.4 MG Caps capsule Commonly known as: FLOMAX Take 0.4 mg by mouth daily after supper.      Follow-up Information    Lysle Pearl, Leanda Padmore, DO Follow up in 2 week(s).   Specialty: Surgery Why: post op Contact information: [address] [place] [address] [phone]  Total time spent arranging discharge was >46min. Signed: Benjamine Sprague 03/23/2019, 6:29 AM

## 2019-03-25 ENCOUNTER — Encounter: Payer: Self-pay | Admitting: Surgery

## 2019-03-25 NOTE — Op Note (Signed)
Preoperative diagnosis:  chronic and cholecystitis  Postoperative diagnosis: same as above  Procedure: Robotic assisted Laparoscopic Cholecystectomy.   Anesthesia: GETA   Surgeon: Benjamine Sprague  Specimen: Gallbladder  Complications: None  EBL: 88mL  Wound Classification: Clean Contaminated  Indications: see HPI  Findings: Critical view of safety noted Cystic duct and artery identified, ligated and divided, clips remained intact at end of procedure Adequate hemostasis  Description of procedure: The patient was placed on the operating table in the supine position. SCDs placed, pre-op abx administered.  General anesthesia was induced and OG tube placed by anesthesia. A time-out was completed verifying correct patient, procedure, site, positioning, and implant(s) and/or special equipment prior to beginning this procedure. The abdomen was prepped and draped in the usual sterile fashion.    Veress needle was placed at the Palmer's point and insufflation was started after confirming a positive saline drop test and no immediate increase in abdominal pressure.  After reaching 15 mm, the Veress needle was removed and a 5 mm Optiview port was placed.  The abdomen was inspected and no abnormalities or injuries were found.  Under direct vision, ports were placed in the following locations: One 12 mm port at the umbilicus, 20 cm from the gallbladder, two 8 mm ports placed to the patient right of the umbilical port 8 cm apart.  1 additional 8 mm port placed 8 cm to the patient left.  Once ports were placed, the Xi platform was brought into the operative field and docked to the ports successfully.  An endoscope was placed through the umbilical port, prograsp through the adjacent patient right port, fenestrated forceps to the far patient left port, and then a hook cautery and the patient right port.   The table was placed in the reverse Trendelenburg position with the right side up.  The dome of the  gallbladder was grasped with prograsp, passed and retracted over the dome of the liver. Adhesions between the gallbladder and omentum, duodenum and transverse colon were lysed via hook cautery. The infundibulum was grasped with the fenestrated grasper and retracted toward the right lower quadrant. This maneuver exposed Calot's triangle. The peritoneum overlying the gallbladder infundibulum was then dissected using combination of [place] dissector and electrocautery hook and the cystic duct and cystic artery identified.  Critical view of safety with the liver bed clearly visible behind the duct and artery with no additional structures noted.  The cystic duct and cystic artery clipped and divided close to the gallbladder.  2 robotic clips were placed at the base of the cystic duct, one clip for the cystic artery.   The gallbladder was then dissected from its peritoneal and liver bed attachments by electrocautery. Hemostasis was checked prior to removing the pro-grasp retractor and placing the endoscope through the port it was in.  Due to extensive dissection required throughout hte procedure, Blake drain was placed within the gallbladder fossa to monitor for any possible delayed bleeding and/or future biliary leak.  The blake drain was inserted through the left lateral port and pulled through into the appropriate place.  The Endo Catch bag was then placed through the 12 mm port site at the umbilicus and the gallbladder was removed.  The gallbladder was passed off the table as a specimen. There was no evidence of bleeding from the gallbladder fossa or cystic artery or leakage of the bile from the cystic duct stump. The umbilical port site closed with PMI using 0 vicryl under direct vision.  Abdomen desufflated  and secondary trocars were removed under direct vision. No bleeding was noted.  3-0 vicryl used to close deep dermal layer at umbilical site.  All skin incisions then closed with subcuticular sutures of 4-0  monocryl and dressed with topical skin adhesive. The orogastric tube was removed and patient extubated. The patient tolerated the procedure well and was taken to the postanesthesia care unit in stable condition.  All sponge and instrument count correct at end of procedure.

## 2019-03-26 LAB — SURGICAL PATHOLOGY

## 2019-03-28 DIAGNOSIS — N200 Calculus of kidney: Secondary | ICD-10-CM | POA: Insufficient documentation

## 2019-03-28 DIAGNOSIS — B019 Varicella without complication: Secondary | ICD-10-CM | POA: Insufficient documentation

## 2019-03-29 ENCOUNTER — Other Ambulatory Visit: Payer: Self-pay

## 2019-03-29 ENCOUNTER — Encounter: Payer: Self-pay | Admitting: Urology

## 2019-03-29 ENCOUNTER — Ambulatory Visit: Payer: PPO | Admitting: Urology

## 2019-03-29 VITALS — BP 132/81 | HR 111 | Ht 65.0 in | Wt 235.0 lb

## 2019-03-29 DIAGNOSIS — N200 Calculus of kidney: Secondary | ICD-10-CM | POA: Diagnosis not present

## 2019-03-29 DIAGNOSIS — R310 Gross hematuria: Secondary | ICD-10-CM

## 2019-03-29 DIAGNOSIS — N401 Enlarged prostate with lower urinary tract symptoms: Secondary | ICD-10-CM

## 2019-03-29 DIAGNOSIS — R351 Nocturia: Secondary | ICD-10-CM

## 2019-03-29 LAB — URINALYSIS, COMPLETE
Bilirubin, UA: NEGATIVE
Glucose, UA: NEGATIVE
Ketones, UA: NEGATIVE
Leukocytes,UA: NEGATIVE
Nitrite, UA: NEGATIVE
Protein,UA: NEGATIVE
Specific Gravity, UA: 1.02 (ref 1.005–1.030)
Urobilinogen, Ur: 0.2 mg/dL (ref 0.2–1.0)
pH, UA: 5 (ref 5.0–7.5)

## 2019-03-29 LAB — MICROSCOPIC EXAMINATION
Bacteria, UA: NONE SEEN
Epithelial Cells (non renal): NONE SEEN /hpf (ref 0–10)

## 2019-03-29 NOTE — Progress Notes (Signed)
03/29/2019 1:25 PM   Corey [name] 1941/01/17 [id number]  Referring provider: Idelle Crouch, MD [facility] [address] [place],  [place] [address]  Chief Complaint  Patient presents with  . Hematuria    HPI: Corey [name] is a 78 y.o. male seen at the request of Dr. Doy Hutching for evaluation of hematuria.  He had onset of abdominal bloating in early August 2020.  He passed urine which he described as "solid black".  He subsequently had onset of chills lasting 48 hours.  He had abnormal liver function test and was subsequently found to have chronic cholecystitis and underwent a cholecystectomy last week.  Urinalysis at [facility] did show 10-50 RBCs on microscopy.  He did have a CT of the abdomen pelvis with and without contrast which showed nonobstructing left lower pole calculi, the largest measuring 9 mm.  He was also noted to have a small simple appearing renal cysts.  He has mild lower urinary tract symptoms consisting of urinary frequency, nocturia x3-4.  He was started on tamsulosin by Dr. Doy Hutching and states his nocturia has improved down from 4-5.  He is on Xarelto for atrial fibrillation.  I previously saw him at [facility] for BPH and erectile dysfunction.  I have not seen him since 2016.   PMH: Past Medical History:  Diagnosis Date  . Arthritis    OSTEOARTHRITIS  . BPH (benign prostatic hyperplasia)   . Bronchitis 2018  . 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 Diabetes mellitus without complication ([place])   . Dysphagia   . Dysrhythmia    A fib  . ED (erectile dysfunction)   . Environmental allergies   . GERD (gastroesophageal reflux disease)   . Glaucoma (increased eye pressure)   . History of chicken pox   . History of colonic polyps   . History of hiatal hernia   . History of kidney stones    h/o  . Hyperlipidemia   . Lower back pain    GOES INTO RIGHT HIP  . Multiple gastric ulcers   . Obesity   . Sleep apnea    does not use cpap     Surgical History: Past Surgical History:  Procedure Laterality Date  . COLONOSCOPY    . COLONOSCOPY WITH PROPOFOL N/A 12/25/2017   Procedure: COLONOSCOPY WITH PROPOFOL;  Surgeon: Manya Silvas, MD;  Location: [facility] ENDOSCOPY;  Service: Endoscopy;  Laterality: N/A;  . ESOPHAGOGASTRODUODENOSCOPY    . ESOPHAGOGASTRODUODENOSCOPY (EGD) WITH PROPOFOL N/A 12/25/2017   Procedure: ESOPHAGOGASTRODUODENOSCOPY (EGD) WITH PROPOFOL;  Surgeon: Manya Silvas, MD;  Location: [facility] ENDOSCOPY;  Service: Endoscopy;  Laterality: N/A;  . EXCISION OF CANCER FROM BACK    . NOSE SURGERY  1970'S   BROKEN NOSE    Home Medications:  Allergies as of 03/29/2019   No Known Allergies     Medication List       Accurate as of March 29, 2019  1:25 PM. If you have any questions, ask your nurse or doctor.        acetaminophen 325 MG tablet Commonly known as: Tylenol Take 2 tablets (650 mg total) by mouth every 8 (eight) hours as needed for mild pain.   allopurinol 100 MG tablet Commonly known as: ZYLOPRIM Take 100 mg by mouth daily at 3 pm.   cetirizine 10 MG tablet Commonly known as: ZYRTEC Take 10 mg by mouth daily at 3 pm.   docusate sodium 100  MG capsule Commonly known as: Colace Take 1 capsule (100 mg total) by mouth 2 (two) times daily as needed for up to 10 days for mild constipation.   glimepiride 2 MG tablet Commonly known as: AMARYL Take 2 mg by mouth daily with breakfast.   hydrochlorothiazide 25 MG tablet Commonly known as: HYDRODIURIL Take 25 mg by mouth daily at 3 pm.   ibuprofen 800 MG tablet Commonly known as: ADVIL Take 1 tablet (800 mg total) by mouth every 8 (eight) hours as needed for mild pain or moderate pain.   omeprazole 20 MG capsule Commonly known as: PRILOSEC Take 20 mg by mouth daily before breakfast.   rivaroxaban 20 MG Tabs tablet Commonly known as: XARELTO Take 20 mg by mouth daily with breakfast.   tamsulosin 0.4 MG Caps capsule Commonly known as:  FLOMAX Take 0.4 mg by mouth daily after supper.       Allergies: No Known Allergies  Family History: No family history on file.  Social History:  reports that he has never smoked. He has never used smokeless tobacco. He reports that he does not drink alcohol or use drugs.  ROS: UROLOGY Frequent Urination?: Yes Hard to postpone urination?: No Burning/pain with urination?: No Get up at night to urinate?: Yes Leakage of urine?: No Urine stream starts and stops?: No Trouble starting stream?: No Do you have to strain to urinate?: No Blood in urine?: No Urinary tract infection?: No Sexually transmitted disease?: No Injury to kidneys or bladder?: No Painful intercourse?: No Weak stream?: No Erection problems?: No Penile pain?: No  Gastrointestinal Nausea?: No Vomiting?: No Indigestion/heartburn?: No Diarrhea?: No Constipation?: No  Constitutional Fever: No Night sweats?: No Weight loss?: No Fatigue?: No  Skin Skin rash/lesions?: No Itching?: No  Eyes Blurred vision?: No Double vision?: No  Ears/Nose/Throat Sore throat?: No Sinus problems?: No  Hematologic/Lymphatic Swollen glands?: No Easy bruising?: No  Cardiovascular Leg swelling?: No Chest pain?: No  Respiratory Cough?: No Shortness of breath?: No  Endocrine Excessive thirst?: No  Musculoskeletal Back pain?: No Joint pain?: No  Neurological Headaches?: No Dizziness?: No  Psychologic Depression?: No Anxiety?: No  Physical Exam: BP 132/81 (BP Location: Left Arm, Patient Position: Sitting, Cuff Size: Normal)   Pulse (!) 111   Ht 5\' 5"  (1.651 m)   Wt 235 lb (106.6 kg)   BMI 39.11 kg/m   Constitutional:  Alert and oriented, No acute distress. HEENT: [place] AT, moist mucus membranes.  Trachea midline, no masses. Cardiovascular: No clubbing, cyanosis, or edema. Respiratory: Normal respiratory effort, no increased work of breathing. GI: Abdomen is soft, nontender, nondistended, no  abdominal masses GU: No CVA tenderness Lymph: No cervical or inguinal lymphadenopathy. Skin: No rashes, bruises or suspicious lesions. Neurologic: Grossly intact, no focal deficits, moving all 4 extremities. Psychiatric: Normal mood and affect.  Pertinent Imaging: CT was personally reviewed  Assessment & Plan:    - Hematuria It is unclear if the appearance of his urine was related to bilirubin however he did have clinically significant microhematuria.  CT urogram showed nonobstructing renal calculi and no other upper tract abnormalities.  I recommended scheduling cystoscopy for a lower tract evaluation.  - BPH with lower urinary tract symptoms He has noted improvement on tamsulosin without side effects.  Will increase the tamsulosin to 0.4 twice daily.  - Nephrolithiasis Nonobstructing left lower pole renal calculi.  Will monitor.   Abbie Sons, [facility] [address] [place], [place] [address] [phone]  

## 2019-03-31 ENCOUNTER — Encounter: Payer: Self-pay | Admitting: Urology

## 2019-03-31 DIAGNOSIS — R351 Nocturia: Secondary | ICD-10-CM | POA: Insufficient documentation

## 2019-03-31 DIAGNOSIS — N401 Enlarged prostate with lower urinary tract symptoms: Secondary | ICD-10-CM | POA: Insufficient documentation

## 2019-04-12 DIAGNOSIS — M25561 Pain in right knee: Secondary | ICD-10-CM | POA: Diagnosis not present

## 2019-04-12 DIAGNOSIS — M9901 Segmental and somatic dysfunction of cervical region: Secondary | ICD-10-CM | POA: Diagnosis not present

## 2019-04-12 DIAGNOSIS — M5412 Radiculopathy, cervical region: Secondary | ICD-10-CM | POA: Diagnosis not present

## 2019-04-15 DIAGNOSIS — M5412 Radiculopathy, cervical region: Secondary | ICD-10-CM | POA: Diagnosis not present

## 2019-04-15 DIAGNOSIS — M25561 Pain in right knee: Secondary | ICD-10-CM | POA: Diagnosis not present

## 2019-04-15 DIAGNOSIS — M9901 Segmental and somatic dysfunction of cervical region: Secondary | ICD-10-CM | POA: Diagnosis not present

## 2019-04-18 ENCOUNTER — Other Ambulatory Visit: Payer: Self-pay

## 2019-04-18 ENCOUNTER — Ambulatory Visit: Payer: PPO | Admitting: Urology

## 2019-04-18 ENCOUNTER — Encounter: Payer: Self-pay | Admitting: Urology

## 2019-04-18 VITALS — BP 126/68 | HR 100 | Ht 65.0 in | Wt 235.0 lb

## 2019-04-18 DIAGNOSIS — R310 Gross hematuria: Secondary | ICD-10-CM

## 2019-04-18 DIAGNOSIS — N200 Calculus of kidney: Secondary | ICD-10-CM

## 2019-04-18 LAB — MICROSCOPIC EXAMINATION: Bacteria, UA: NONE SEEN

## 2019-04-18 LAB — URINALYSIS, COMPLETE
Bilirubin, UA: NEGATIVE
Glucose, UA: NEGATIVE
Nitrite, UA: NEGATIVE
Protein,UA: NEGATIVE
Specific Gravity, UA: 1.02 (ref 1.005–1.030)
Urobilinogen, Ur: 0.2 mg/dL (ref 0.2–1.0)
pH, UA: 5.5 (ref 5.0–7.5)

## 2019-04-18 MED ORDER — LIDOCAINE HCL URETHRAL/MUCOSAL 2 % EX GEL
1.0000 "application " | Freq: Once | CUTANEOUS | Status: AC
  Administered 2019-04-18: 1 via URETHRAL

## 2019-04-18 MED ORDER — SILDENAFIL CITRATE 20 MG PO TABS: ORAL_TABLET | ORAL | 0 refills | Status: DC

## 2019-04-18 NOTE — Progress Notes (Signed)
   04/18/19  CC: No chief complaint on file.   HPI: Refer to my previous office note 03/29/2019.  He had clinically significant microhematuria and questionable gross hematuria.  CT abdomen/pelvis with and without contrast showed nonobstructing left lower pole calculi.  There were no vitals taken for this visit. NED. A&Ox3.     Cystoscopy Procedure Note  Patient identification was confirmed, informed consent was obtained, and patient was prepped using Betadine solution.  Lidocaine jelly was administered per urethral meatus.     Pre-Procedure: - Inspection reveals a normal caliber urethral meatus.  Procedure: The flexible cystoscope was introduced without difficulty - No urethral strictures/lesions are present. - Touching lateral lobes with prostatic urethral length >4 cm prostate  - Moderate elevation bladder neck - Bilateral ureteral orifices identified - Bladder mucosa  reveals no ulcers, tumors, or lesions - No bladder stones -Mild trabeculation  Retroflexion shows no intravesical median lobe   Post-Procedure: - Patient tolerated the procedure well  Assessment/ Plan: - Prostatic enlargement and no other significant findings  -Follow-up 6 months with [facility] for monitoring nephrolithiasis  -He requested refill sildenafil   Abbie Sons, MD

## 2019-04-19 DIAGNOSIS — X32XXXA Exposure to sunlight, initial encounter: Secondary | ICD-10-CM | POA: Diagnosis not present

## 2019-04-19 DIAGNOSIS — L821 Other seborrheic keratosis: Secondary | ICD-10-CM | POA: Diagnosis not present

## 2019-04-19 DIAGNOSIS — M25561 Pain in right knee: Secondary | ICD-10-CM | POA: Diagnosis not present

## 2019-04-19 DIAGNOSIS — Z85828 Personal history of other malignant neoplasm of skin: Secondary | ICD-10-CM | POA: Diagnosis not present

## 2019-04-19 DIAGNOSIS — D2262 Melanocytic nevi of left upper limb, including shoulder: Secondary | ICD-10-CM | POA: Diagnosis not present

## 2019-04-19 DIAGNOSIS — L570 Actinic keratosis: Secondary | ICD-10-CM | POA: Diagnosis not present

## 2019-04-19 DIAGNOSIS — M9901 Segmental and somatic dysfunction of cervical region: Secondary | ICD-10-CM | POA: Diagnosis not present

## 2019-04-19 DIAGNOSIS — D2261 Melanocytic nevi of right upper limb, including shoulder: Secondary | ICD-10-CM | POA: Diagnosis not present

## 2019-04-19 DIAGNOSIS — M5412 Radiculopathy, cervical region: Secondary | ICD-10-CM | POA: Diagnosis not present

## 2019-04-19 DIAGNOSIS — L538 Other specified erythematous conditions: Secondary | ICD-10-CM | POA: Diagnosis not present

## 2019-04-19 DIAGNOSIS — L820 Inflamed seborrheic keratosis: Secondary | ICD-10-CM | POA: Diagnosis not present

## 2019-04-19 DIAGNOSIS — D225 Melanocytic nevi of trunk: Secondary | ICD-10-CM | POA: Diagnosis not present

## 2019-04-22 ENCOUNTER — Telehealth: Payer: Self-pay | Admitting: Urology

## 2019-04-22 DIAGNOSIS — N529 Male erectile dysfunction, unspecified: Secondary | ICD-10-CM

## 2019-04-22 MED ORDER — SILDENAFIL CITRATE 20 MG PO TABS: ORAL_TABLET | ORAL | 0 refills | Status: DC

## 2019-04-22 NOTE — Telephone Encounter (Signed)
Pt called and states that his Sildenafil is $340.00 at [facility]. He would like a call back for recommendation for a cheaper place to get it.

## 2019-04-22 NOTE — Telephone Encounter (Signed)
RX cancelled at [facility] and sent to [facility]. Pt informed.

## 2019-04-22 NOTE — Telephone Encounter (Signed)
Pt called back and asked if you could send his rx for Sildenafil to [facility]. He looked up [facility] and it is cheapest at [facility].

## 2019-04-26 DIAGNOSIS — M9901 Segmental and somatic dysfunction of cervical region: Secondary | ICD-10-CM | POA: Diagnosis not present

## 2019-04-26 DIAGNOSIS — M25561 Pain in right knee: Secondary | ICD-10-CM | POA: Diagnosis not present

## 2019-04-26 DIAGNOSIS — M5412 Radiculopathy, cervical region: Secondary | ICD-10-CM | POA: Diagnosis not present

## 2019-04-28 ENCOUNTER — Encounter: Payer: Self-pay | Admitting: Surgery

## 2019-04-29 DIAGNOSIS — M9901 Segmental and somatic dysfunction of cervical region: Secondary | ICD-10-CM | POA: Diagnosis not present

## 2019-04-29 DIAGNOSIS — M5412 Radiculopathy, cervical region: Secondary | ICD-10-CM | POA: Diagnosis not present

## 2019-04-29 DIAGNOSIS — M25561 Pain in right knee: Secondary | ICD-10-CM | POA: Diagnosis not present

## 2019-05-03 DIAGNOSIS — M9901 Segmental and somatic dysfunction of cervical region: Secondary | ICD-10-CM | POA: Diagnosis not present

## 2019-05-03 DIAGNOSIS — M5412 Radiculopathy, cervical region: Secondary | ICD-10-CM | POA: Diagnosis not present

## 2019-05-03 DIAGNOSIS — M25561 Pain in right knee: Secondary | ICD-10-CM | POA: Diagnosis not present

## 2019-05-13 DIAGNOSIS — M5412 Radiculopathy, cervical region: Secondary | ICD-10-CM | POA: Diagnosis not present

## 2019-05-13 DIAGNOSIS — M9901 Segmental and somatic dysfunction of cervical region: Secondary | ICD-10-CM | POA: Diagnosis not present

## 2019-05-13 DIAGNOSIS — M25561 Pain in right knee: Secondary | ICD-10-CM | POA: Diagnosis not present

## 2019-05-20 DIAGNOSIS — Z79899 Other long term (current) drug therapy: Secondary | ICD-10-CM | POA: Diagnosis not present

## 2019-05-20 DIAGNOSIS — E785 Hyperlipidemia, unspecified: Secondary | ICD-10-CM | POA: Diagnosis not present

## 2019-05-20 DIAGNOSIS — E119 Type 2 diabetes mellitus without complications: Secondary | ICD-10-CM | POA: Diagnosis not present

## 2019-05-27 DIAGNOSIS — E785 Hyperlipidemia, unspecified: Secondary | ICD-10-CM | POA: Diagnosis not present

## 2019-05-27 DIAGNOSIS — I480 Paroxysmal atrial fibrillation: Secondary | ICD-10-CM | POA: Diagnosis not present

## 2019-05-27 DIAGNOSIS — E119 Type 2 diabetes mellitus without complications: Secondary | ICD-10-CM | POA: Diagnosis not present

## 2019-05-27 DIAGNOSIS — Z79899 Other long term (current) drug therapy: Secondary | ICD-10-CM | POA: Diagnosis not present

## 2019-06-10 DIAGNOSIS — M25561 Pain in right knee: Secondary | ICD-10-CM | POA: Diagnosis not present

## 2019-06-10 DIAGNOSIS — M9901 Segmental and somatic dysfunction of cervical region: Secondary | ICD-10-CM | POA: Diagnosis not present

## 2019-06-10 DIAGNOSIS — M5412 Radiculopathy, cervical region: Secondary | ICD-10-CM | POA: Diagnosis not present

## 2019-06-26 DIAGNOSIS — K219 Gastro-esophageal reflux disease without esophagitis: Secondary | ICD-10-CM | POA: Diagnosis not present

## 2019-06-26 DIAGNOSIS — Z6839 Body mass index (BMI) 39.0-39.9, adult: Secondary | ICD-10-CM | POA: Diagnosis not present

## 2019-06-26 DIAGNOSIS — E785 Hyperlipidemia, unspecified: Secondary | ICD-10-CM | POA: Diagnosis not present

## 2019-06-26 DIAGNOSIS — R001 Bradycardia, unspecified: Secondary | ICD-10-CM | POA: Diagnosis not present

## 2019-06-26 DIAGNOSIS — I4891 Unspecified atrial fibrillation: Secondary | ICD-10-CM | POA: Diagnosis not present

## 2019-06-26 DIAGNOSIS — G4733 Obstructive sleep apnea (adult) (pediatric): Secondary | ICD-10-CM | POA: Diagnosis not present

## 2019-06-26 DIAGNOSIS — R0602 Shortness of breath: Secondary | ICD-10-CM | POA: Diagnosis not present

## 2019-07-12 IMAGING — US US EXTREM LOW VENOUS*L*
1 series · 13 of 24 positions shown · non-contrast
Comparison: None.

CLINICAL DATA: 77-year-old male with pain and swelling posterior to
the left knee



[Series 1: us extrem low venous*left* · 0.09mm/px · 13 of 42 slices shown]
[im 1/42]
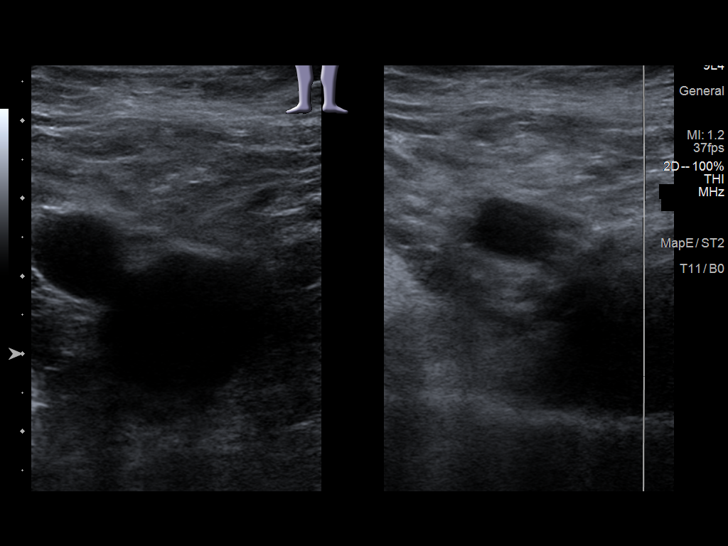
[im 4/42]
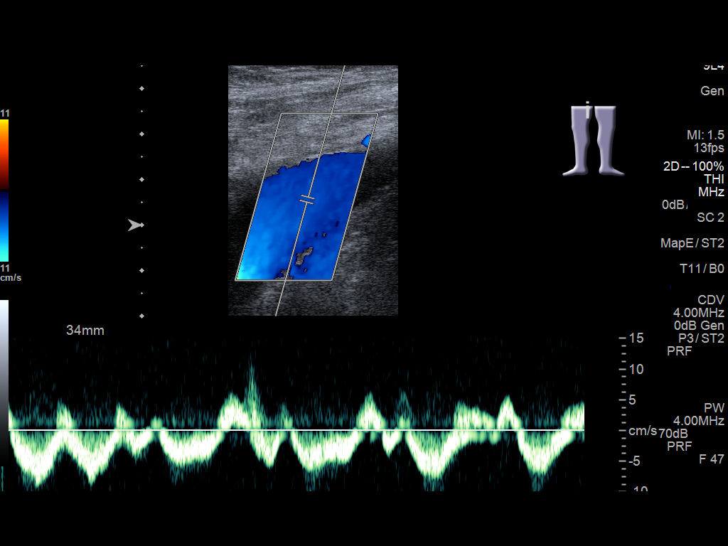
[im 8/42]
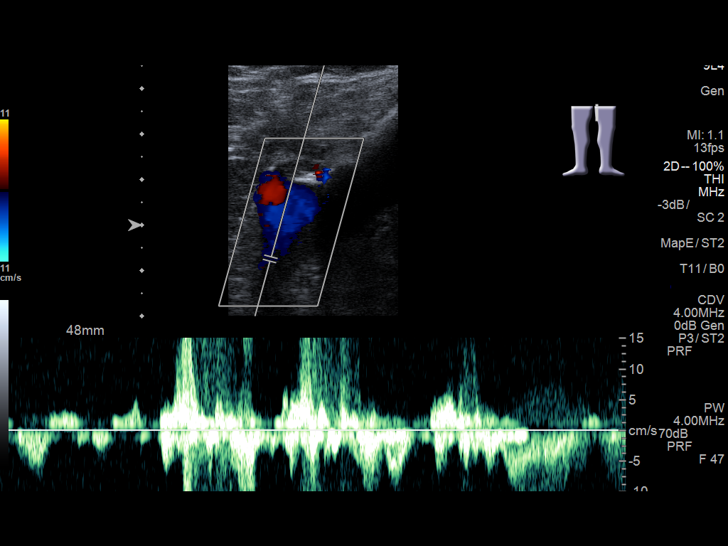
[im 11/42]
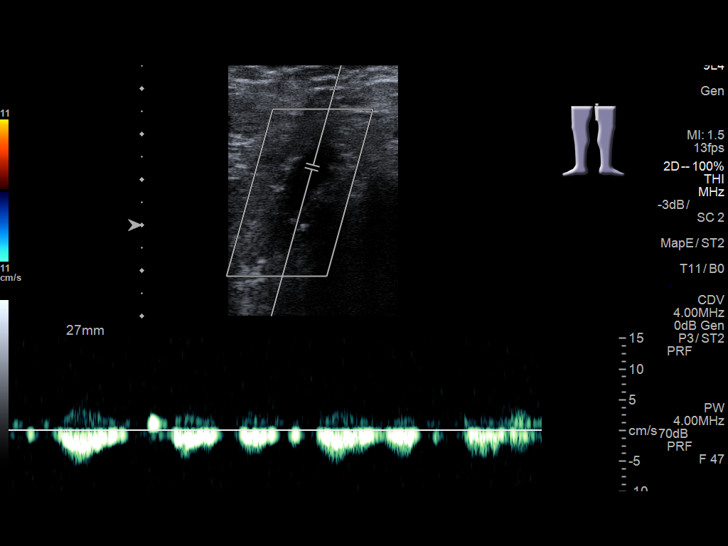
[im 15/42]
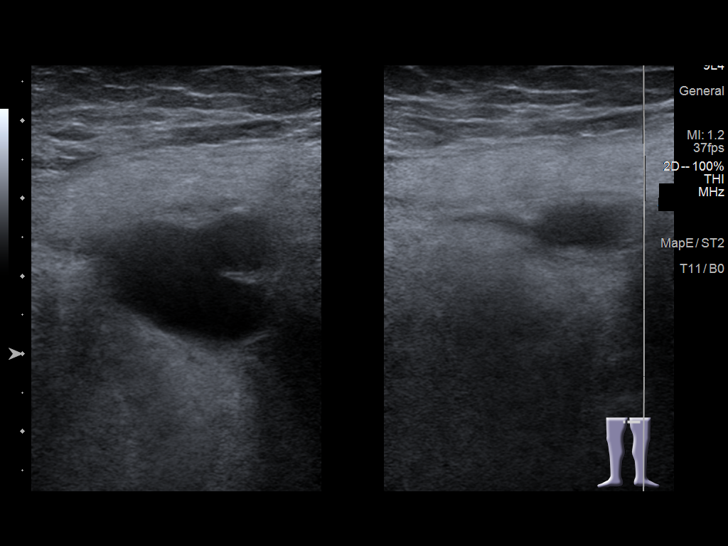
[im 18/42]
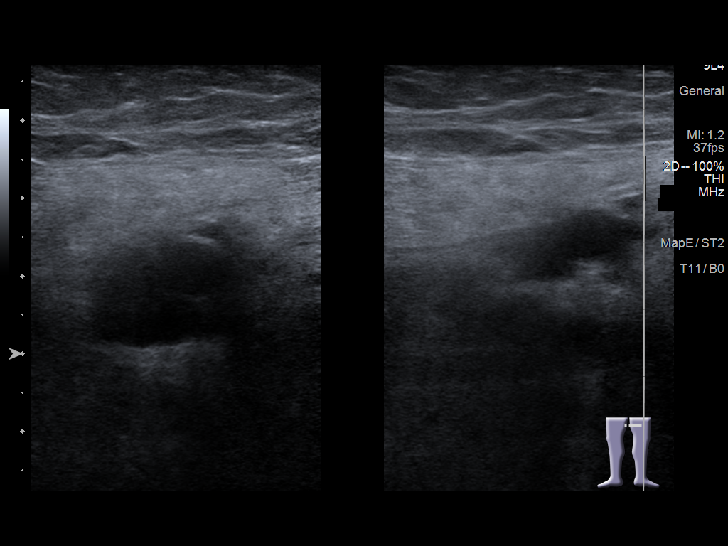
[im 22/42]
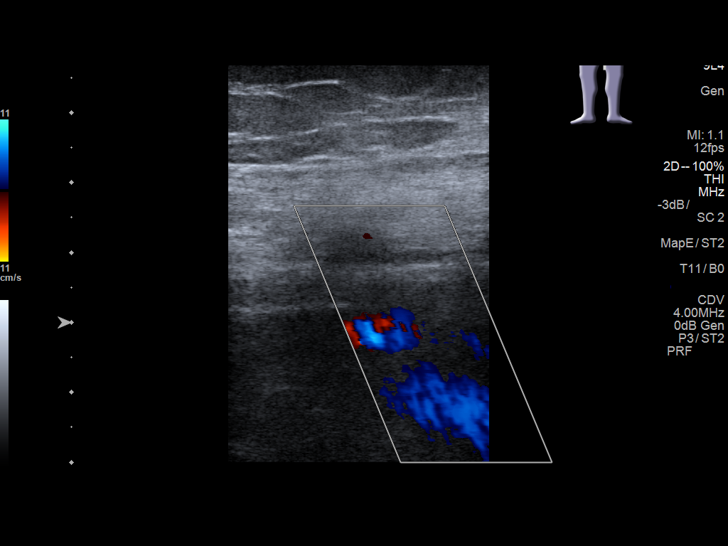
[im 24/42]
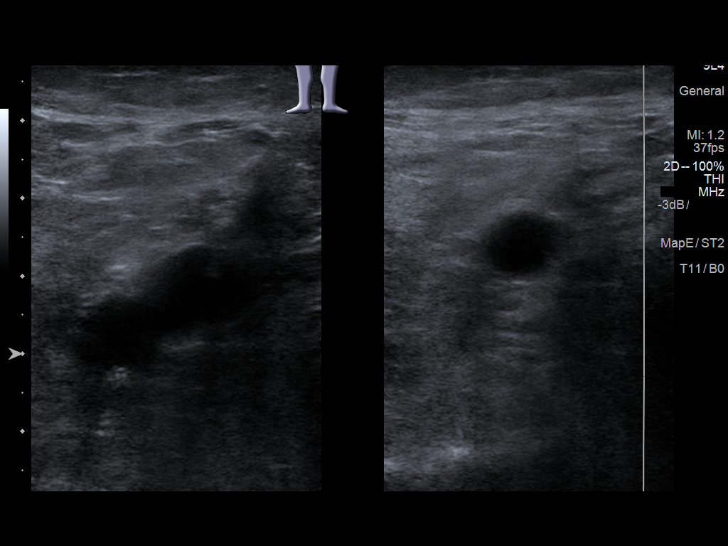
[im 27/42]
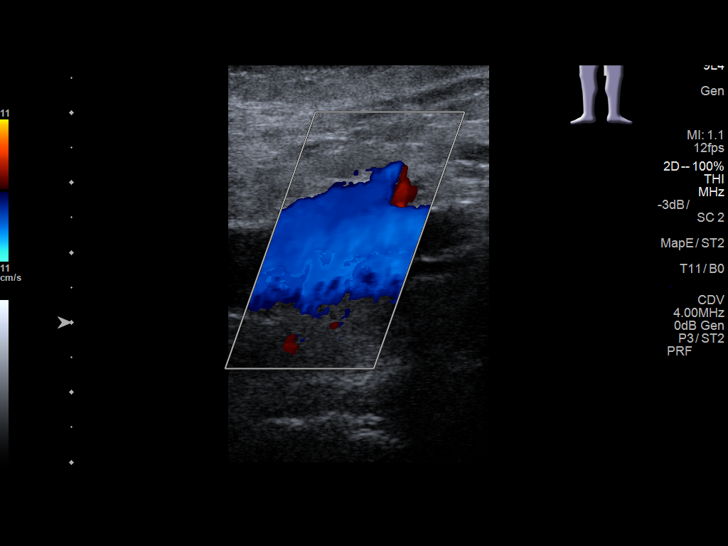
[im 31/42]
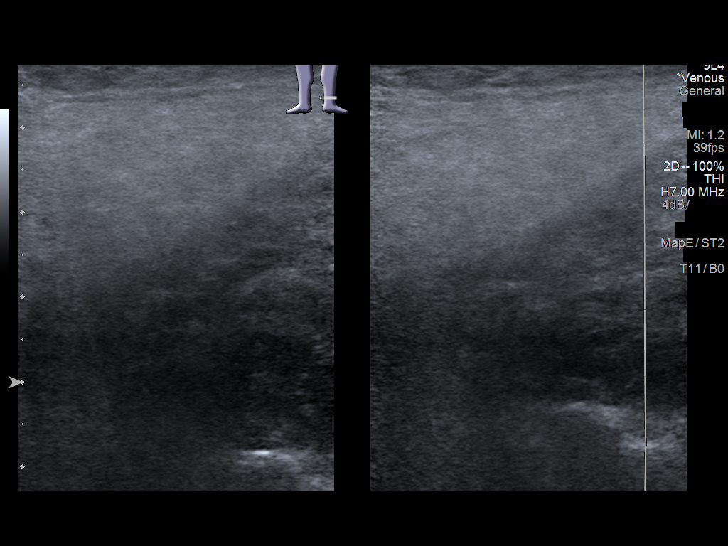
[im 34/42]
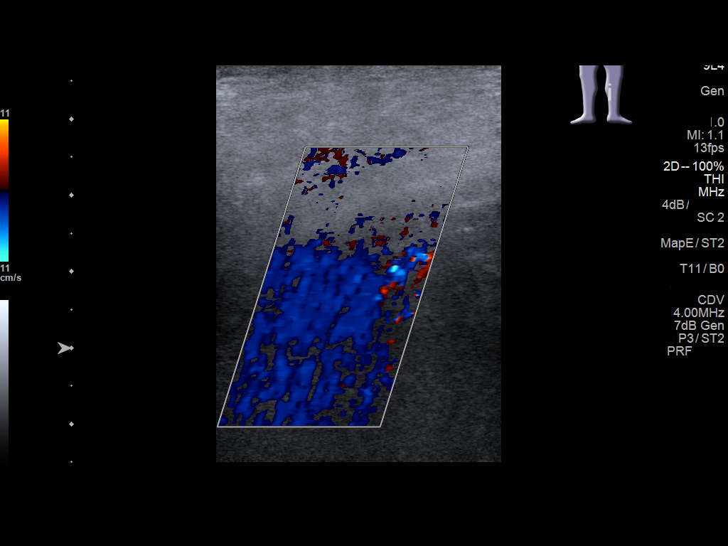
[im 38/42]
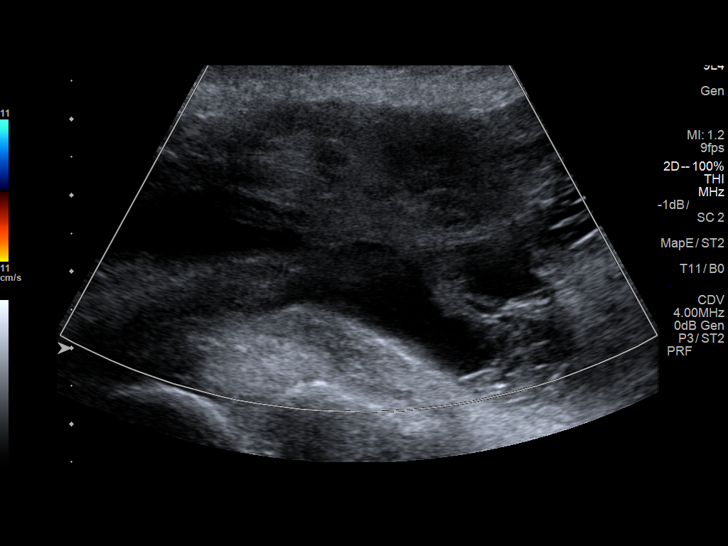
[im 42/42]
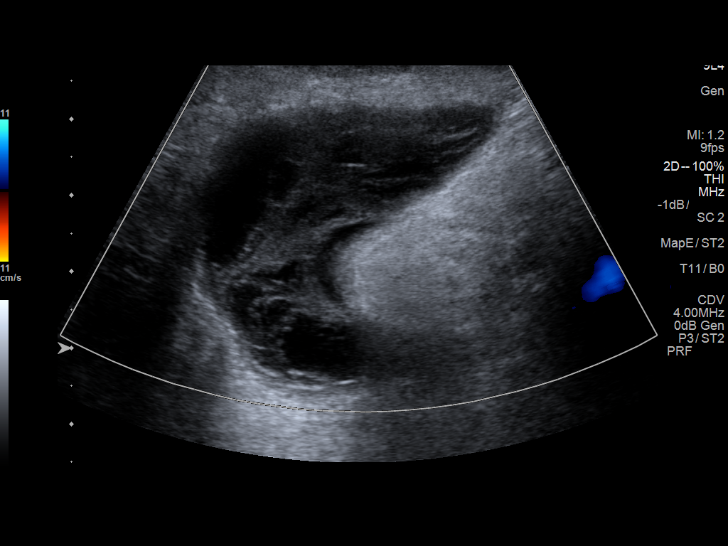

[13 of 24 positions shown; findings below may reference images not displayed]

FINDINGS: Contralateral Common Femoral Vein: Respiratory phasicity is normal
and symmetric with the symptomatic side. No evidence of thrombus.
Normal compressibility.

Common Femoral Vein: No evidence of thrombus. Normal
compressibility, respiratory phasicity and response to augmentation.

Saphenofemoral Junction: No evidence of thrombus. Normal
compressibility and flow on color Doppler imaging.

Profunda Femoral Vein: No evidence of thrombus. Normal
compressibility and flow on color Doppler imaging.

Femoral Vein: No evidence of thrombus. Normal compressibility,
respiratory phasicity and response to augmentation.

Popliteal Vein: No evidence of thrombus. Normal compressibility,
respiratory phasicity and response to augmentation.

Calf Veins: No evidence of thrombus. Normal compressibility and flow
on color Doppler imaging.

Superficial Great Saphenous Vein: No evidence of thrombus. Normal
compressibility.

Venous Reflux:  None.

Other Findings: Complex cyst in the popliteal fossa measures
approximately 5.9 x 3.8 x 4.3 cm.
IMPRESSION: 1. No evidence of deep venous thrombosis in the left lower
extremity.
2. Large complex Baker's cyst.

## 2019-08-20 DIAGNOSIS — E119 Type 2 diabetes mellitus without complications: Secondary | ICD-10-CM | POA: Diagnosis not present

## 2019-08-20 DIAGNOSIS — Z79899 Other long term (current) drug therapy: Secondary | ICD-10-CM | POA: Diagnosis not present

## 2019-08-20 DIAGNOSIS — E785 Hyperlipidemia, unspecified: Secondary | ICD-10-CM | POA: Diagnosis not present

## 2019-08-27 DIAGNOSIS — Z0000 Encounter for general adult medical examination without abnormal findings: Secondary | ICD-10-CM | POA: Diagnosis not present

## 2019-08-27 DIAGNOSIS — E785 Hyperlipidemia, unspecified: Secondary | ICD-10-CM | POA: Diagnosis not present

## 2019-08-27 DIAGNOSIS — Z125 Encounter for screening for malignant neoplasm of prostate: Secondary | ICD-10-CM | POA: Diagnosis not present

## 2019-08-27 DIAGNOSIS — Z79899 Other long term (current) drug therapy: Secondary | ICD-10-CM | POA: Diagnosis not present

## 2019-08-27 DIAGNOSIS — I480 Paroxysmal atrial fibrillation: Secondary | ICD-10-CM | POA: Diagnosis not present

## 2019-08-27 DIAGNOSIS — E119 Type 2 diabetes mellitus without complications: Secondary | ICD-10-CM | POA: Diagnosis not present

## 2019-08-27 DIAGNOSIS — I10 Essential (primary) hypertension: Secondary | ICD-10-CM | POA: Diagnosis not present

## 2019-10-17 ENCOUNTER — Ambulatory Visit: Payer: PPO | Admitting: Urology

## 2019-10-17 ENCOUNTER — Encounter: Payer: Self-pay | Admitting: Urology

## 2019-10-17 ENCOUNTER — Other Ambulatory Visit: Payer: Self-pay

## 2019-10-17 ENCOUNTER — Ambulatory Visit
Admission: RE | Admit: 2019-10-17 | Discharge: 2019-10-17 | Disposition: A | Discharge status: Home / Self Care | Payer: PPO | Source: Ambulatory Visit | Attending: Urology | Admitting: Urology

## 2019-10-17 VITALS — BP 138/74 | HR 74 | Ht 68.0 in | Wt 239.0 lb

## 2019-10-17 DIAGNOSIS — N200 Calculus of kidney: Secondary | ICD-10-CM | POA: Diagnosis not present

## 2019-10-17 MED ORDER — TAMSULOSIN HCL 0.4 MG PO CAPS: 0.8000 mg | ORAL_CAPSULE | Freq: Every day | ORAL | 0 refills | Status: AC

## 2019-10-17 NOTE — Progress Notes (Signed)
10/17/2019 09:15 AM  Corey [name] 1941/04/23 [id number]  Referring provider: Idelle Crouch, MD [facility] [place],  [place] [address]  Chief Complaint  Patient presents with  . Follow-up    HPI: Corey [name] is a 79 yo male who returns tody for evaluation and management of BPH w/ LUTS and nephrolithiasis.   -Pt had clinically significant microhematuria and questionable gross hematuria from UA, 04/18/2019 -CT abdomen/pelvis with and without contrast on 02/19/2019 showed nonobstructing left lower pole calculi. -Prostatic enlargement and no other significant findings from cysto 04/18/2019 -Increased urinary frequency and nocturia  x3 since last visit -Remains on 0.4 mg of flomax  -States hx of sleep apnea and not currently using CPAP -Denies gross hematuria -Stones in left kidney haven't moved from [facility], 10/17/2019 -Denies dysuria or flank, abdominal, pelvic pain -States hx of prostate cancer from father, wanted to have PSA rechecked, most recent PSA, normal  PMH: Past Medical History:  Diagnosis Date  . Arthritis    OSTEOARTHRITIS  . BPH (benign prostatic hyperplasia)   . Bronchitis 2018  . 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 Diabetes mellitus without complication ([place])   . Dysphagia   . Dysrhythmia    A fib  . ED (erectile dysfunction)   . Environmental allergies   . GERD (gastroesophageal reflux disease)   . Glaucoma (increased eye pressure)   . History of chicken pox   . History of colonic polyps   . History of hiatal hernia   . History of kidney stones    h/o  . Hyperlipidemia   . Lower back pain    GOES INTO RIGHT HIP  . Multiple gastric ulcers   . Obesity   . Sleep apnea    does not use cpap    Surgical History: Past Surgical History:  Procedure Laterality Date  . COLONOSCOPY    . COLONOSCOPY WITH PROPOFOL N/A 12/25/2017   Procedure: COLONOSCOPY WITH PROPOFOL;  Surgeon: Manya Silvas, MD;   Location: [facility] ENDOSCOPY;  Service: Endoscopy;  Laterality: N/A;  . ESOPHAGOGASTRODUODENOSCOPY    . ESOPHAGOGASTRODUODENOSCOPY (EGD) WITH PROPOFOL N/A 12/25/2017   Procedure: ESOPHAGOGASTRODUODENOSCOPY (EGD) WITH PROPOFOL;  Surgeon: Manya Silvas, MD;  Location: [facility] ENDOSCOPY;  Service: Endoscopy;  Laterality: N/A;  . EXCISION OF CANCER FROM BACK    . NOSE SURGERY  1970'S   BROKEN NOSE    Home Medications:  Allergies as of 10/17/2019   No Known Allergies     Medication List       Accurate as of October 17, 2019  9:31 PM. If you have any questions, ask your nurse or doctor.        allopurinol 100 MG tablet Commonly known as: ZYLOPRIM Take 100 mg by mouth daily at 3 pm.   cetirizine 10 MG tablet Commonly known as: ZYRTEC Take 10 mg by mouth daily at 3 pm.   glimepiride 2 MG tablet Commonly known as: AMARYL Take 2 mg by mouth daily with breakfast.   hydrochlorothiazide 25 MG tablet Commonly known as: HYDRODIURIL Take 25 mg by mouth daily at 3 pm.   ibuprofen 800 MG tablet Commonly known as: ADVIL Take 1 tablet (800 mg total) by mouth every 8 (eight) hours as needed for mild pain or moderate pain.   omeprazole 20 MG capsule Commonly known as: PRILOSEC Take 20 mg by mouth daily before breakfast.   rivaroxaban 20 MG Tabs tablet Commonly  known as: XARELTO Take 20 mg by mouth daily with breakfast.   sildenafil 20 MG tablet Commonly known as: REVATIO 2-5 tabs 1 hour prior to intercourse   tamsulosin 0.4 MG Caps capsule Commonly known as: FLOMAX Take 0.4 mg by mouth daily after supper.       Allergies: No Known Allergies  Family History: No family history on file.  Social History:  reports that he has never smoked. He has never used smokeless tobacco. He reports that he does not drink alcohol or use drugs.   Physical Exam: BP 138/74   Pulse 74   Ht 5\' 8"  (1.727 m)   Wt 239 lb (108.4 kg)   BMI 36.34 kg/m   Constitutional:  Alert and oriented, No acute  distress. HEENT: [place] AT, moist mucus membranes.  Trachea midline, no masses. Cardiovascular: No clubbing, cyanosis, or edema. Respiratory: Normal respiratory effort, no increased work of breathing. Skin: No rashes, bruises or suspicious lesions. Neurologic: Grossly intact, no focal deficits, moving all 4 extremities. Psychiatric: Normal mood and affect.  Pertinent imaging: [facility] reviewed, see Epic.  Assessment & Plan:   1. BPH w/ lower Urinary Tract symptoms -Increase in urine frequency and nocturia, will increase Tamsulosin to 0.8 mg daily -Discussed untreated CPAP as common cause of nocturia -Recommend eval for new CPAP machine w/ primary care physician -We discussed current prostate cancer screening guidelines and that PSA screening no longer recommended after age 58 and up to 60 and healthy patients -He requested a PSA however Dr. Doy Hutching checked a PSA May 2020 which was low at 1.14  2. Nephrolithiasis -[facility] today showed left lower pole kidney stones -1 yr f/u for [place], MD    [facility] [address], [place], [place] [address] [phone]  I, Noland Fordyce, am acting as a scribe for Dr. John Giovanni.

## 2019-12-25 DIAGNOSIS — I10 Essential (primary) hypertension: Secondary | ICD-10-CM | POA: Diagnosis not present

## 2019-12-25 DIAGNOSIS — Z79899 Other long term (current) drug therapy: Secondary | ICD-10-CM | POA: Diagnosis not present

## 2019-12-25 DIAGNOSIS — E785 Hyperlipidemia, unspecified: Secondary | ICD-10-CM | POA: Diagnosis not present

## 2019-12-25 DIAGNOSIS — E119 Type 2 diabetes mellitus without complications: Secondary | ICD-10-CM | POA: Diagnosis not present

## 2019-12-25 DIAGNOSIS — Z125 Encounter for screening for malignant neoplasm of prostate: Secondary | ICD-10-CM | POA: Diagnosis not present

## 2019-12-26 DIAGNOSIS — K219 Gastro-esophageal reflux disease without esophagitis: Secondary | ICD-10-CM | POA: Diagnosis not present

## 2019-12-26 DIAGNOSIS — R001 Bradycardia, unspecified: Secondary | ICD-10-CM | POA: Diagnosis not present

## 2019-12-26 DIAGNOSIS — R0600 Dyspnea, unspecified: Secondary | ICD-10-CM | POA: Diagnosis not present

## 2019-12-26 DIAGNOSIS — Z6839 Body mass index (BMI) 39.0-39.9, adult: Secondary | ICD-10-CM | POA: Diagnosis not present

## 2019-12-26 DIAGNOSIS — I4891 Unspecified atrial fibrillation: Secondary | ICD-10-CM | POA: Diagnosis not present

## 2019-12-26 DIAGNOSIS — E669 Obesity, unspecified: Secondary | ICD-10-CM | POA: Diagnosis not present

## 2019-12-26 DIAGNOSIS — E785 Hyperlipidemia, unspecified: Secondary | ICD-10-CM | POA: Diagnosis not present

## 2019-12-26 DIAGNOSIS — I208 Other forms of angina pectoris: Secondary | ICD-10-CM | POA: Diagnosis not present

## 2019-12-26 DIAGNOSIS — G4733 Obstructive sleep apnea (adult) (pediatric): Secondary | ICD-10-CM | POA: Diagnosis not present

## 2019-12-26 DIAGNOSIS — R0602 Shortness of breath: Secondary | ICD-10-CM | POA: Diagnosis not present

## 2020-01-23 DIAGNOSIS — G4733 Obstructive sleep apnea (adult) (pediatric): Secondary | ICD-10-CM | POA: Diagnosis not present

## 2020-01-23 DIAGNOSIS — I10 Essential (primary) hypertension: Secondary | ICD-10-CM | POA: Diagnosis not present

## 2020-01-23 DIAGNOSIS — I480 Paroxysmal atrial fibrillation: Secondary | ICD-10-CM | POA: Diagnosis not present

## 2020-01-23 DIAGNOSIS — E118 Type 2 diabetes mellitus with unspecified complications: Secondary | ICD-10-CM | POA: Diagnosis not present

## 2020-01-23 DIAGNOSIS — Z6838 Body mass index (BMI) 38.0-38.9, adult: Secondary | ICD-10-CM | POA: Diagnosis not present

## 2020-01-23 DIAGNOSIS — Z0000 Encounter for general adult medical examination without abnormal findings: Secondary | ICD-10-CM | POA: Diagnosis not present

## 2020-01-23 DIAGNOSIS — E785 Hyperlipidemia, unspecified: Secondary | ICD-10-CM | POA: Diagnosis not present

## 2020-02-05 DIAGNOSIS — R0602 Shortness of breath: Secondary | ICD-10-CM | POA: Diagnosis not present

## 2020-02-05 DIAGNOSIS — I208 Other forms of angina pectoris: Secondary | ICD-10-CM | POA: Diagnosis not present

## 2020-02-12 DIAGNOSIS — Z6839 Body mass index (BMI) 39.0-39.9, adult: Secondary | ICD-10-CM | POA: Diagnosis not present

## 2020-02-12 DIAGNOSIS — I208 Other forms of angina pectoris: Secondary | ICD-10-CM | POA: Diagnosis not present

## 2020-02-12 DIAGNOSIS — R0600 Dyspnea, unspecified: Secondary | ICD-10-CM | POA: Diagnosis not present

## 2020-02-12 DIAGNOSIS — R001 Bradycardia, unspecified: Secondary | ICD-10-CM | POA: Diagnosis not present

## 2020-02-12 DIAGNOSIS — K219 Gastro-esophageal reflux disease without esophagitis: Secondary | ICD-10-CM | POA: Diagnosis not present

## 2020-02-12 DIAGNOSIS — E669 Obesity, unspecified: Secondary | ICD-10-CM | POA: Diagnosis not present

## 2020-02-12 DIAGNOSIS — R0602 Shortness of breath: Secondary | ICD-10-CM | POA: Diagnosis not present

## 2020-02-12 DIAGNOSIS — E785 Hyperlipidemia, unspecified: Secondary | ICD-10-CM | POA: Diagnosis not present

## 2020-02-12 DIAGNOSIS — G4733 Obstructive sleep apnea (adult) (pediatric): Secondary | ICD-10-CM | POA: Diagnosis not present

## 2020-02-12 DIAGNOSIS — I4891 Unspecified atrial fibrillation: Secondary | ICD-10-CM | POA: Diagnosis not present

## 2020-04-22 DIAGNOSIS — Z85828 Personal history of other malignant neoplasm of skin: Secondary | ICD-10-CM | POA: Diagnosis not present

## 2020-04-22 DIAGNOSIS — L570 Actinic keratosis: Secondary | ICD-10-CM | POA: Diagnosis not present

## 2020-04-22 DIAGNOSIS — L821 Other seborrheic keratosis: Secondary | ICD-10-CM | POA: Diagnosis not present

## 2020-04-22 DIAGNOSIS — D2261 Melanocytic nevi of right upper limb, including shoulder: Secondary | ICD-10-CM | POA: Diagnosis not present

## 2020-04-22 DIAGNOSIS — D2262 Melanocytic nevi of left upper limb, including shoulder: Secondary | ICD-10-CM | POA: Diagnosis not present

## 2020-04-22 DIAGNOSIS — X32XXXA Exposure to sunlight, initial encounter: Secondary | ICD-10-CM | POA: Diagnosis not present

## 2020-04-22 DIAGNOSIS — D225 Melanocytic nevi of trunk: Secondary | ICD-10-CM | POA: Diagnosis not present

## 2020-05-26 DIAGNOSIS — E785 Hyperlipidemia, unspecified: Secondary | ICD-10-CM | POA: Diagnosis not present

## 2020-05-26 DIAGNOSIS — E118 Type 2 diabetes mellitus with unspecified complications: Secondary | ICD-10-CM | POA: Diagnosis not present

## 2020-05-26 DIAGNOSIS — Z23 Encounter for immunization: Secondary | ICD-10-CM | POA: Diagnosis not present

## 2020-05-26 DIAGNOSIS — I10 Essential (primary) hypertension: Secondary | ICD-10-CM | POA: Diagnosis not present

## 2020-05-26 DIAGNOSIS — Z6837 Body mass index (BMI) 37.0-37.9, adult: Secondary | ICD-10-CM | POA: Diagnosis not present

## 2020-05-26 DIAGNOSIS — Z79899 Other long term (current) drug therapy: Secondary | ICD-10-CM | POA: Diagnosis not present

## 2020-05-26 DIAGNOSIS — I480 Paroxysmal atrial fibrillation: Secondary | ICD-10-CM | POA: Diagnosis not present

## 2020-09-23 DIAGNOSIS — E782 Mixed hyperlipidemia: Secondary | ICD-10-CM | POA: Diagnosis not present

## 2020-09-23 DIAGNOSIS — R0600 Dyspnea, unspecified: Secondary | ICD-10-CM | POA: Diagnosis not present

## 2020-09-23 DIAGNOSIS — E118 Type 2 diabetes mellitus with unspecified complications: Secondary | ICD-10-CM | POA: Diagnosis not present

## 2020-09-23 DIAGNOSIS — I480 Paroxysmal atrial fibrillation: Secondary | ICD-10-CM | POA: Diagnosis not present

## 2020-09-23 DIAGNOSIS — I10 Essential (primary) hypertension: Secondary | ICD-10-CM | POA: Diagnosis not present

## 2020-09-23 DIAGNOSIS — Z79899 Other long term (current) drug therapy: Secondary | ICD-10-CM | POA: Diagnosis not present

## 2020-10-08 IMAGING — US ULTRASOUND ABDOMEN LIMITED
1 series · 14 of 25 positions shown · non-contrast
Comparison: CT dated 02/19/2019

CLINICAL DATA: Right upper quadrant abdominal pain

EXAM:
ULTRASOUND ABDOMEN LIMITED RIGHT UPPER QUADRANT

[Series 1: ultrasound abdomen limited · 14 of 45 slices shown]
[im 1/45]
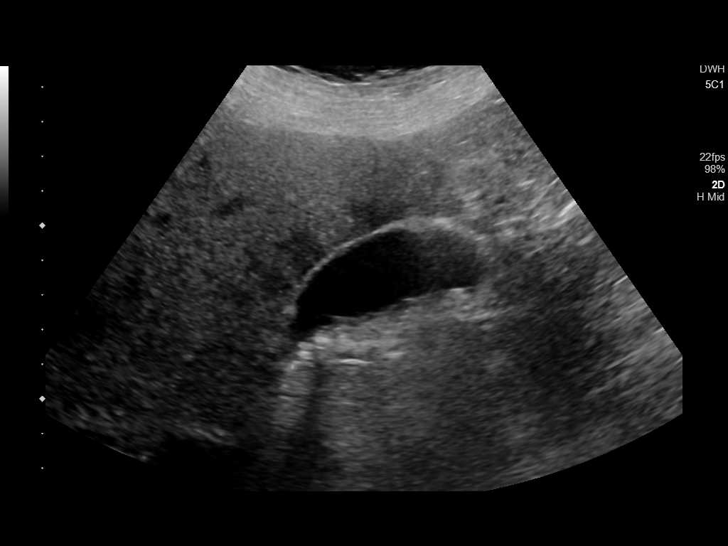
[im 4/45]
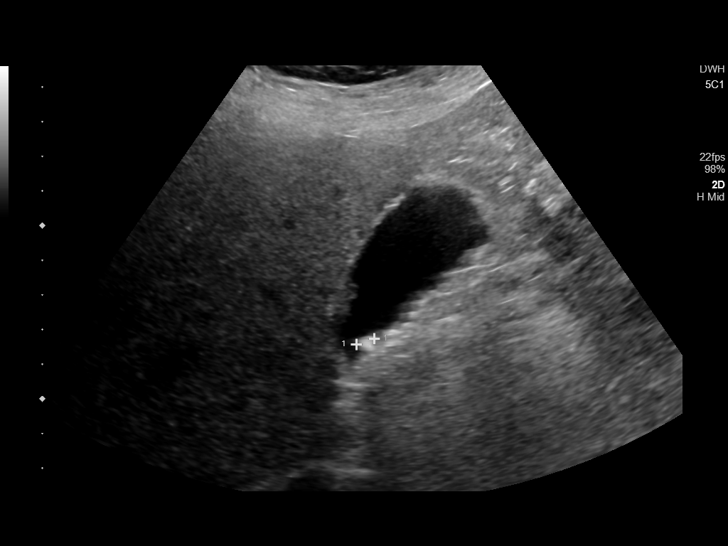
[im 8/45]
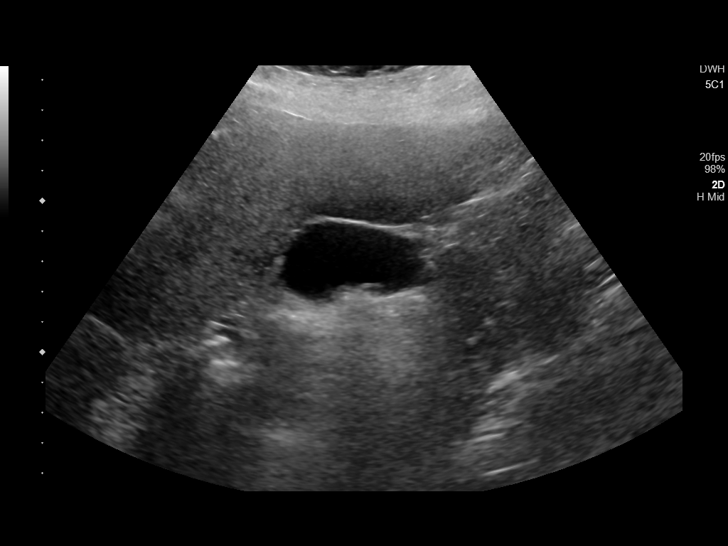
[im 12/45]
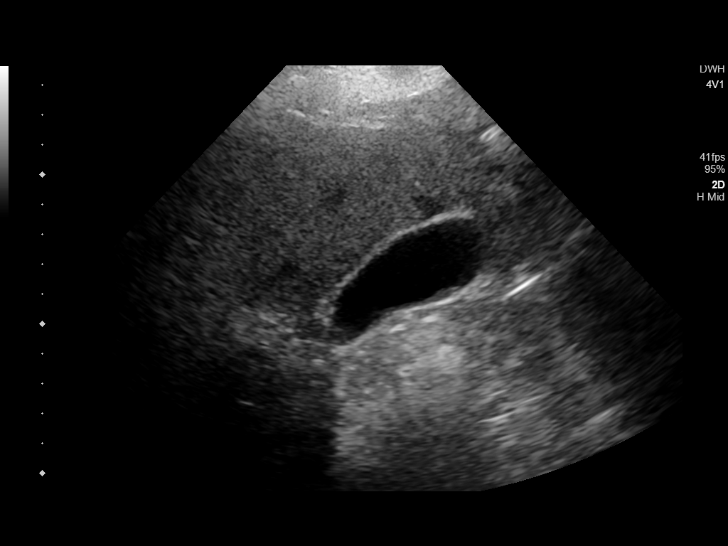
[im 15/45]
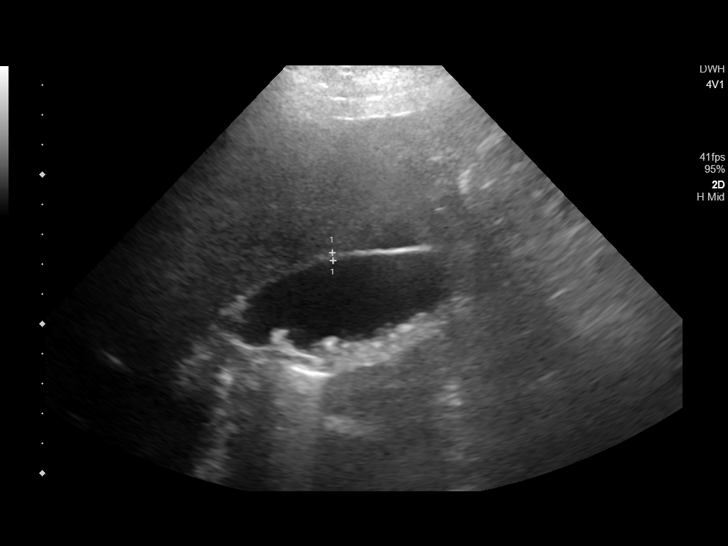
[im 17/45]
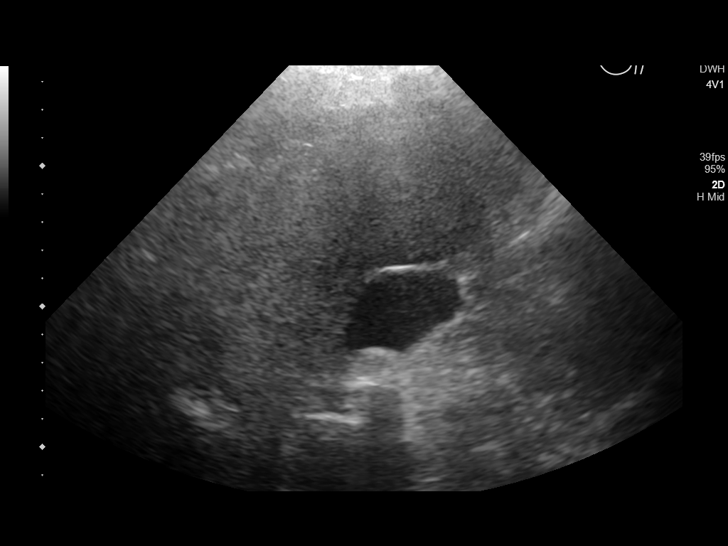
[im 21/45]
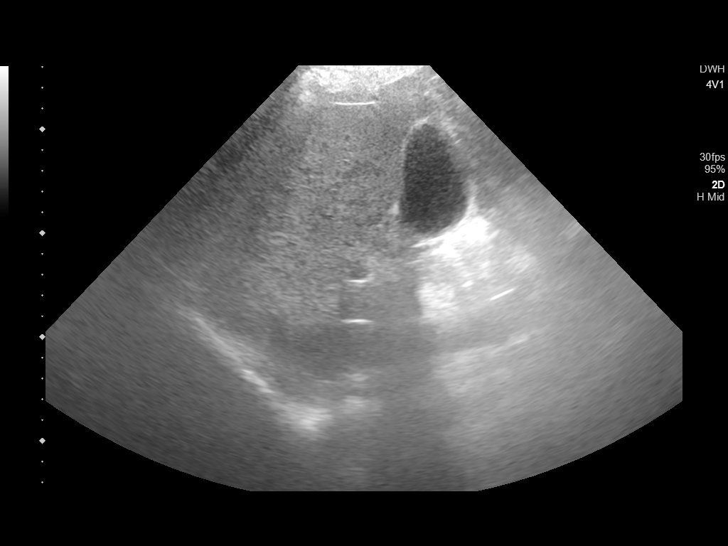
[im 24/45]
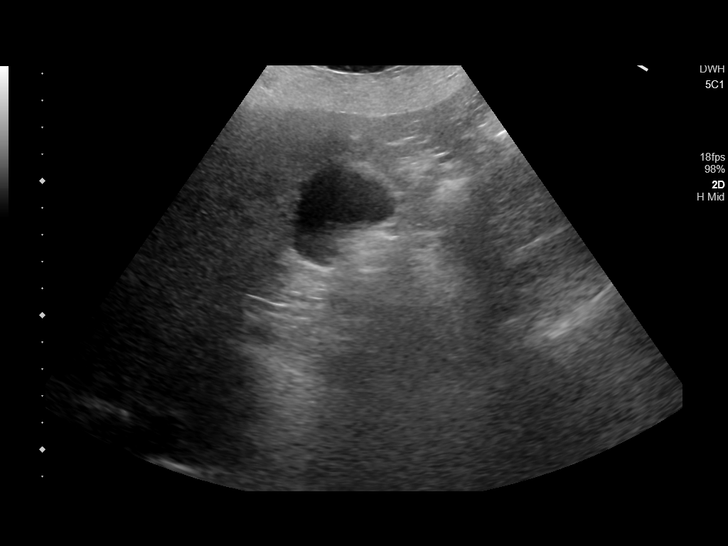
[im 28/45]
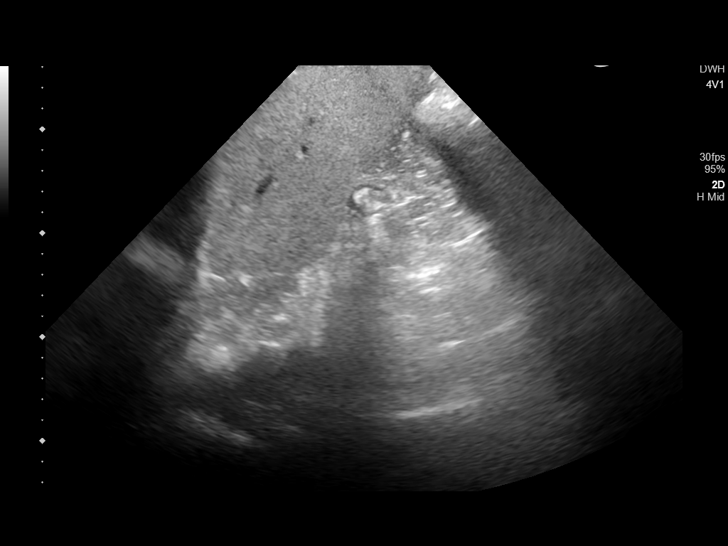
[im 30/45]
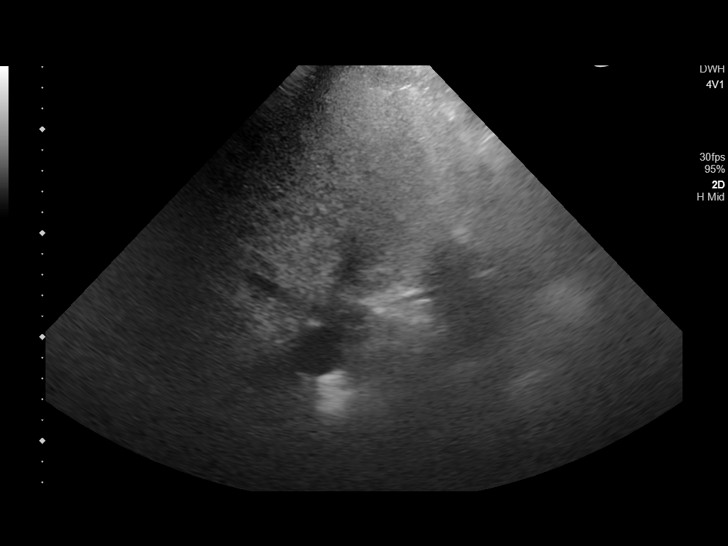
[im 34/45]
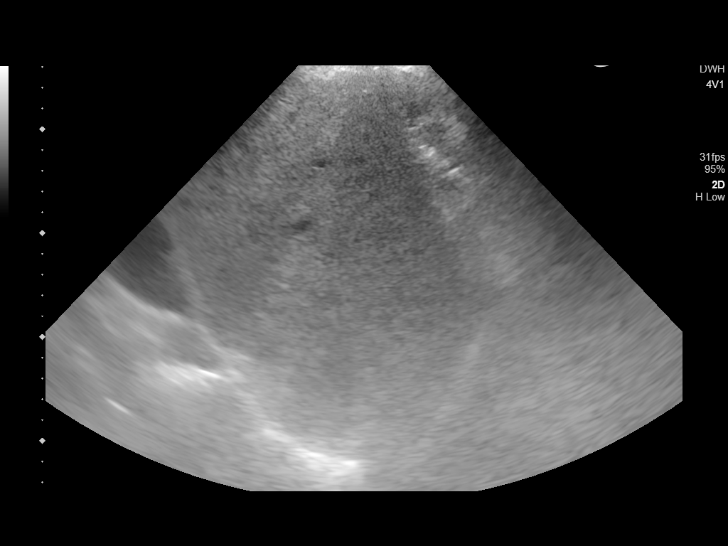
[im 37/45]
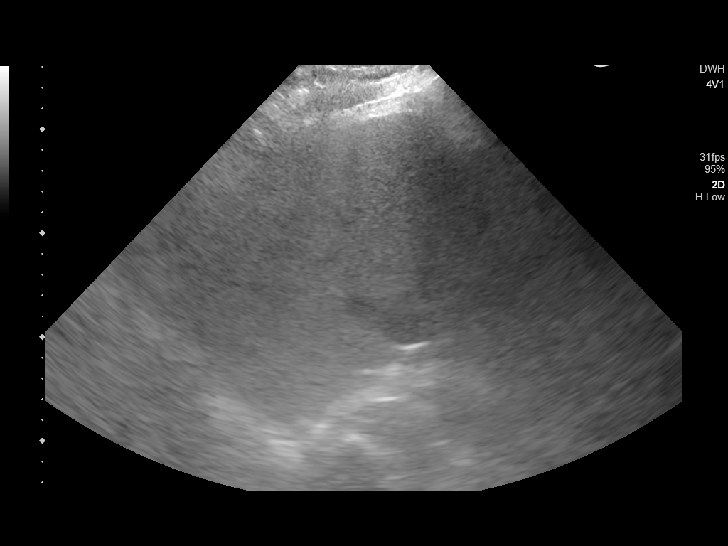
[im 41/45]
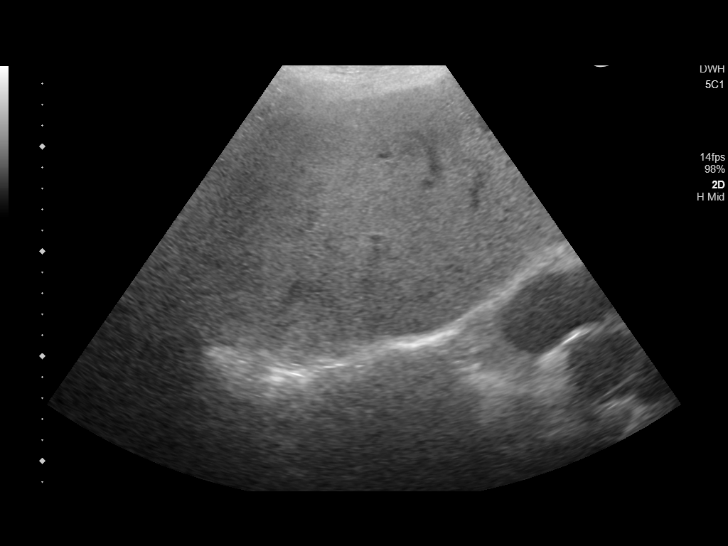
[im 45/45]
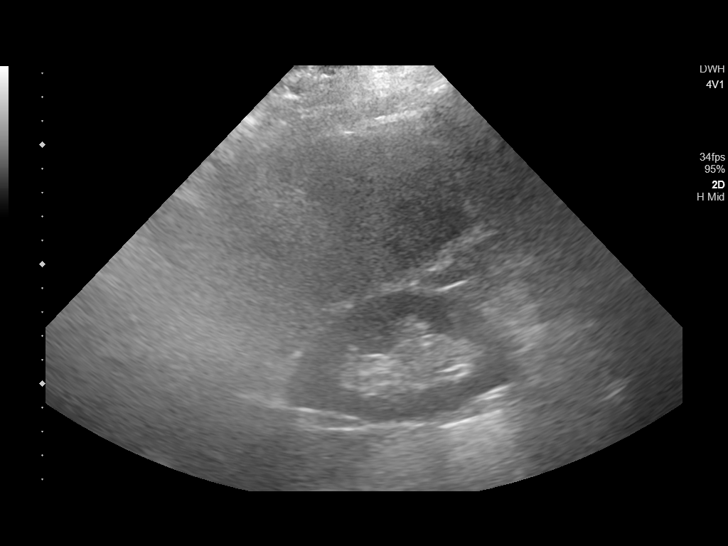

[14 of 25 positions shown; findings below may reference images not displayed]

FINDINGS: Gallbladder:

Multiple gallstones are noted. There is no gallbladder wall
thickening. There is no pericholecystic free fluid. The sonographic
Murphy sign is reported as negative.

Common bile duct:

Diameter: 4 mm

Liver:

Diffuse increased echogenicity with slightly heterogeneous liver.
Appearance typically secondary to fatty infiltration. Fibrosis
secondary consideration. No secondary findings of cirrhosis noted.
The noted hepatic hemangioma is difficult to visualize likely
secondary to the presence of diffuse hepatic steatosis. Portal vein
is patent on color Doppler imaging with normal direction of blood
flow towards the liver.

Other: None.
IMPRESSION: 1. There is cholelithiasis without secondary signs of acute
cholecystitis.
2. Hepatic steatosis.
3. Known right hepatic lobe hemangioma is difficult to visualized
secondary to the presence of diffuse hepatic steatosis.

## 2020-10-16 ENCOUNTER — Ambulatory Visit: Payer: Self-pay | Admitting: Urology

## 2020-10-28 DIAGNOSIS — I10 Essential (primary) hypertension: Secondary | ICD-10-CM | POA: Diagnosis not present

## 2020-10-28 DIAGNOSIS — E119 Type 2 diabetes mellitus without complications: Secondary | ICD-10-CM | POA: Diagnosis not present

## 2020-10-28 DIAGNOSIS — I480 Paroxysmal atrial fibrillation: Secondary | ICD-10-CM | POA: Diagnosis not present

## 2020-10-28 DIAGNOSIS — E782 Mixed hyperlipidemia: Secondary | ICD-10-CM | POA: Diagnosis not present

## 2020-10-28 DIAGNOSIS — K219 Gastro-esophageal reflux disease without esophagitis: Secondary | ICD-10-CM | POA: Diagnosis not present

## 2020-10-28 DIAGNOSIS — R0602 Shortness of breath: Secondary | ICD-10-CM | POA: Diagnosis not present

## 2020-10-28 DIAGNOSIS — E669 Obesity, unspecified: Secondary | ICD-10-CM | POA: Diagnosis not present

## 2020-10-28 DIAGNOSIS — G4730 Sleep apnea, unspecified: Secondary | ICD-10-CM | POA: Diagnosis not present

## 2020-10-28 DIAGNOSIS — I4891 Unspecified atrial fibrillation: Secondary | ICD-10-CM | POA: Diagnosis not present

## 2021-03-10 DIAGNOSIS — I480 Paroxysmal atrial fibrillation: Secondary | ICD-10-CM | POA: Diagnosis not present

## 2021-03-10 DIAGNOSIS — E782 Mixed hyperlipidemia: Secondary | ICD-10-CM | POA: Diagnosis not present

## 2021-03-10 DIAGNOSIS — I10 Essential (primary) hypertension: Secondary | ICD-10-CM | POA: Diagnosis not present

## 2021-03-10 DIAGNOSIS — Z79899 Other long term (current) drug therapy: Secondary | ICD-10-CM | POA: Diagnosis not present

## 2021-03-10 DIAGNOSIS — E118 Type 2 diabetes mellitus with unspecified complications: Secondary | ICD-10-CM | POA: Diagnosis not present

## 2021-03-10 DIAGNOSIS — G4733 Obstructive sleep apnea (adult) (pediatric): Secondary | ICD-10-CM | POA: Diagnosis not present

## 2021-03-10 DIAGNOSIS — Z125 Encounter for screening for malignant neoplasm of prostate: Secondary | ICD-10-CM | POA: Diagnosis not present

## 2021-03-10 DIAGNOSIS — Z0000 Encounter for general adult medical examination without abnormal findings: Secondary | ICD-10-CM | POA: Diagnosis not present

## 2021-04-22 DIAGNOSIS — D225 Melanocytic nevi of trunk: Secondary | ICD-10-CM | POA: Diagnosis not present

## 2021-04-22 DIAGNOSIS — D2272 Melanocytic nevi of left lower limb, including hip: Secondary | ICD-10-CM | POA: Diagnosis not present

## 2021-04-22 DIAGNOSIS — L02221 Furuncle of abdominal wall: Secondary | ICD-10-CM | POA: Diagnosis not present

## 2021-04-22 DIAGNOSIS — L570 Actinic keratosis: Secondary | ICD-10-CM | POA: Diagnosis not present

## 2021-04-22 DIAGNOSIS — X32XXXA Exposure to sunlight, initial encounter: Secondary | ICD-10-CM | POA: Diagnosis not present

## 2021-04-22 DIAGNOSIS — D2271 Melanocytic nevi of right lower limb, including hip: Secondary | ICD-10-CM | POA: Diagnosis not present

## 2021-04-22 DIAGNOSIS — D2261 Melanocytic nevi of right upper limb, including shoulder: Secondary | ICD-10-CM | POA: Diagnosis not present

## 2021-04-22 DIAGNOSIS — L821 Other seborrheic keratosis: Secondary | ICD-10-CM | POA: Diagnosis not present

## 2021-04-22 DIAGNOSIS — D2262 Melanocytic nevi of left upper limb, including shoulder: Secondary | ICD-10-CM | POA: Diagnosis not present

## 2021-04-22 DIAGNOSIS — L309 Dermatitis, unspecified: Secondary | ICD-10-CM | POA: Diagnosis not present

## 2021-04-29 DIAGNOSIS — E669 Obesity, unspecified: Secondary | ICD-10-CM | POA: Diagnosis not present

## 2021-04-29 DIAGNOSIS — G4730 Sleep apnea, unspecified: Secondary | ICD-10-CM | POA: Diagnosis not present

## 2021-04-29 DIAGNOSIS — E119 Type 2 diabetes mellitus without complications: Secondary | ICD-10-CM | POA: Diagnosis not present

## 2021-04-29 DIAGNOSIS — R0602 Shortness of breath: Secondary | ICD-10-CM | POA: Diagnosis not present

## 2021-04-29 DIAGNOSIS — E782 Mixed hyperlipidemia: Secondary | ICD-10-CM | POA: Diagnosis not present

## 2021-04-29 DIAGNOSIS — I208 Other forms of angina pectoris: Secondary | ICD-10-CM | POA: Diagnosis not present

## 2021-04-29 DIAGNOSIS — K219 Gastro-esophageal reflux disease without esophagitis: Secondary | ICD-10-CM | POA: Diagnosis not present

## 2021-04-29 DIAGNOSIS — I10 Essential (primary) hypertension: Secondary | ICD-10-CM | POA: Diagnosis not present

## 2021-04-29 DIAGNOSIS — I480 Paroxysmal atrial fibrillation: Secondary | ICD-10-CM | POA: Diagnosis not present

## 2021-08-30 DIAGNOSIS — I700 Atherosclerosis of aorta: Secondary | ICD-10-CM | POA: Diagnosis not present

## 2021-08-30 DIAGNOSIS — J40 Bronchitis, not specified as acute or chronic: Secondary | ICD-10-CM | POA: Diagnosis not present

## 2021-08-30 DIAGNOSIS — E118 Type 2 diabetes mellitus with unspecified complications: Secondary | ICD-10-CM | POA: Diagnosis not present

## 2021-08-30 DIAGNOSIS — I10 Essential (primary) hypertension: Secondary | ICD-10-CM | POA: Diagnosis not present

## 2021-08-30 DIAGNOSIS — I480 Paroxysmal atrial fibrillation: Secondary | ICD-10-CM | POA: Diagnosis not present

## 2021-08-30 DIAGNOSIS — K219 Gastro-esophageal reflux disease without esophagitis: Secondary | ICD-10-CM | POA: Diagnosis not present

## 2021-09-08 DIAGNOSIS — E782 Mixed hyperlipidemia: Secondary | ICD-10-CM | POA: Diagnosis not present

## 2021-09-08 DIAGNOSIS — I10 Essential (primary) hypertension: Secondary | ICD-10-CM | POA: Diagnosis not present

## 2021-09-08 DIAGNOSIS — I480 Paroxysmal atrial fibrillation: Secondary | ICD-10-CM | POA: Diagnosis not present

## 2021-09-08 DIAGNOSIS — E118 Type 2 diabetes mellitus with unspecified complications: Secondary | ICD-10-CM | POA: Diagnosis not present

## 2021-09-08 DIAGNOSIS — Z0000 Encounter for general adult medical examination without abnormal findings: Secondary | ICD-10-CM | POA: Diagnosis not present

## 2021-09-08 DIAGNOSIS — Z79899 Other long term (current) drug therapy: Secondary | ICD-10-CM | POA: Diagnosis not present

## 2021-10-27 DIAGNOSIS — I480 Paroxysmal atrial fibrillation: Secondary | ICD-10-CM | POA: Diagnosis not present

## 2022-03-18 DIAGNOSIS — I480 Paroxysmal atrial fibrillation: Secondary | ICD-10-CM | POA: Diagnosis not present

## 2022-03-18 DIAGNOSIS — Z0000 Encounter for general adult medical examination without abnormal findings: Secondary | ICD-10-CM | POA: Diagnosis not present

## 2022-03-18 DIAGNOSIS — E782 Mixed hyperlipidemia: Secondary | ICD-10-CM | POA: Diagnosis not present

## 2022-03-18 DIAGNOSIS — Z125 Encounter for screening for malignant neoplasm of prostate: Secondary | ICD-10-CM | POA: Diagnosis not present

## 2022-03-18 DIAGNOSIS — E118 Type 2 diabetes mellitus with unspecified complications: Secondary | ICD-10-CM | POA: Diagnosis not present

## 2022-03-18 DIAGNOSIS — I10 Essential (primary) hypertension: Secondary | ICD-10-CM | POA: Diagnosis not present

## 2022-03-18 DIAGNOSIS — Z79899 Other long term (current) drug therapy: Secondary | ICD-10-CM | POA: Diagnosis not present

## 2022-03-21 DIAGNOSIS — I10 Essential (primary) hypertension: Secondary | ICD-10-CM | POA: Diagnosis not present

## 2022-03-21 DIAGNOSIS — Z125 Encounter for screening for malignant neoplasm of prostate: Secondary | ICD-10-CM | POA: Diagnosis not present

## 2022-03-21 DIAGNOSIS — E118 Type 2 diabetes mellitus with unspecified complications: Secondary | ICD-10-CM | POA: Diagnosis not present

## 2022-03-21 DIAGNOSIS — E782 Mixed hyperlipidemia: Secondary | ICD-10-CM | POA: Diagnosis not present

## 2022-03-21 DIAGNOSIS — Z79899 Other long term (current) drug therapy: Secondary | ICD-10-CM | POA: Diagnosis not present

## 2022-04-27 DIAGNOSIS — B353 Tinea pedis: Secondary | ICD-10-CM | POA: Diagnosis not present

## 2022-04-27 DIAGNOSIS — L814 Other melanin hyperpigmentation: Secondary | ICD-10-CM | POA: Diagnosis not present

## 2022-04-27 DIAGNOSIS — L570 Actinic keratosis: Secondary | ICD-10-CM | POA: Diagnosis not present

## 2022-04-27 DIAGNOSIS — D225 Melanocytic nevi of trunk: Secondary | ICD-10-CM | POA: Diagnosis not present

## 2022-04-27 DIAGNOSIS — D2261 Melanocytic nevi of right upper limb, including shoulder: Secondary | ICD-10-CM | POA: Diagnosis not present

## 2022-04-27 DIAGNOSIS — L309 Dermatitis, unspecified: Secondary | ICD-10-CM | POA: Diagnosis not present

## 2022-04-27 DIAGNOSIS — D2262 Melanocytic nevi of left upper limb, including shoulder: Secondary | ICD-10-CM | POA: Diagnosis not present

## 2022-04-27 DIAGNOSIS — D2271 Melanocytic nevi of right lower limb, including hip: Secondary | ICD-10-CM | POA: Diagnosis not present

## 2022-04-27 DIAGNOSIS — D2272 Melanocytic nevi of left lower limb, including hip: Secondary | ICD-10-CM | POA: Diagnosis not present

## 2022-04-27 DIAGNOSIS — L821 Other seborrheic keratosis: Secondary | ICD-10-CM | POA: Diagnosis not present

## 2022-04-28 DIAGNOSIS — I10 Essential (primary) hypertension: Secondary | ICD-10-CM | POA: Diagnosis not present

## 2022-04-28 DIAGNOSIS — K219 Gastro-esophageal reflux disease without esophagitis: Secondary | ICD-10-CM | POA: Diagnosis not present

## 2022-04-28 DIAGNOSIS — E669 Obesity, unspecified: Secondary | ICD-10-CM | POA: Diagnosis not present

## 2022-04-28 DIAGNOSIS — R0602 Shortness of breath: Secondary | ICD-10-CM | POA: Diagnosis not present

## 2022-04-28 DIAGNOSIS — E782 Mixed hyperlipidemia: Secondary | ICD-10-CM | POA: Diagnosis not present

## 2022-04-28 DIAGNOSIS — I480 Paroxysmal atrial fibrillation: Secondary | ICD-10-CM | POA: Diagnosis not present

## 2022-04-28 DIAGNOSIS — I2089 Other forms of angina pectoris: Secondary | ICD-10-CM | POA: Diagnosis not present

## 2022-04-28 DIAGNOSIS — E119 Type 2 diabetes mellitus without complications: Secondary | ICD-10-CM | POA: Diagnosis not present

## 2022-04-28 DIAGNOSIS — G4730 Sleep apnea, unspecified: Secondary | ICD-10-CM | POA: Diagnosis not present

## 2022-06-05 DIAGNOSIS — E119 Type 2 diabetes mellitus without complications: Secondary | ICD-10-CM | POA: Diagnosis not present

## 2022-06-05 DIAGNOSIS — Z79899 Other long term (current) drug therapy: Secondary | ICD-10-CM | POA: Diagnosis not present

## 2022-06-05 DIAGNOSIS — E785 Hyperlipidemia, unspecified: Secondary | ICD-10-CM | POA: Diagnosis not present

## 2022-06-05 DIAGNOSIS — Z7984 Long term (current) use of oral hypoglycemic drugs: Secondary | ICD-10-CM | POA: Diagnosis not present

## 2022-06-05 DIAGNOSIS — Z7901 Long term (current) use of anticoagulants: Secondary | ICD-10-CM | POA: Diagnosis not present

## 2022-06-05 DIAGNOSIS — K219 Gastro-esophageal reflux disease without esophagitis: Secondary | ICD-10-CM | POA: Diagnosis not present

## 2022-06-05 DIAGNOSIS — R197 Diarrhea, unspecified: Secondary | ICD-10-CM | POA: Diagnosis not present

## 2022-06-05 DIAGNOSIS — N200 Calculus of kidney: Secondary | ICD-10-CM | POA: Diagnosis not present

## 2022-06-05 DIAGNOSIS — R195 Other fecal abnormalities: Secondary | ICD-10-CM | POA: Diagnosis not present

## 2022-06-05 DIAGNOSIS — K56600 Partial intestinal obstruction, unspecified as to cause: Secondary | ICD-10-CM | POA: Diagnosis not present

## 2022-06-05 DIAGNOSIS — I10 Essential (primary) hypertension: Secondary | ICD-10-CM | POA: Diagnosis not present

## 2022-06-06 DIAGNOSIS — N200 Calculus of kidney: Secondary | ICD-10-CM | POA: Diagnosis not present

## 2022-06-10 DIAGNOSIS — Z03818 Encounter for observation for suspected exposure to other biological agents ruled out: Secondary | ICD-10-CM | POA: Diagnosis not present

## 2023-03-29 ENCOUNTER — Encounter: Payer: Self-pay | Admitting: Ophthalmology

## 2023-03-30 NOTE — Anesthesia Preprocedure Evaluation (Addendum)
Anesthesia Evaluation  Patient identified by MRN, date of birth, ID band Patient awake    Reviewed: Allergy & Precautions, H&P , NPO status , Patient's Chart, lab work & pertinent test results  Airway Mallampati: III  TM Distance: <3 FB Neck ROM: Full    Dental no notable dental hx. (+) Caps   Pulmonary sleep apnea  DOE, Sleep apnea, intolerant of CPAP   Pulmonary exam normal breath sounds clear to auscultation       Cardiovascular hypertension, Normal cardiovascular exam+ dysrhythmias  Rhythm:Regular Rate:Normal  Echocardiogram 2D complete: (02/05/2020) INTERPRETATION NORMAL LEFT VENTRICULAR SYSTOLIC FUNCTION WITH AN ESTIMATED EF = >55 % NORMAL RIGHT VENTRICULAR SYSTOLIC FUNCTION MODERATE MITRAL VALVE INSUFFICIENCY MILD TRICUSPID VALVE INSUFFICIENCY NO VALVULAR STENOSIS MODERATE LA ENLARGEMENT MILD RV ENLARGEMENT MILD RA ENLARGEMENT  NM Myocardial Perfusion SPECT multiple (stress and rest): (02/05/2020) IMPRESSION: Normal myocardial perfusion scan no evidence of stress-induced myocardial ischemia ejection fraction of 58% conclusion negative scan     Neuro/Psych negative neurological ROS  negative psych ROS   GI/Hepatic Neg liver ROS, hiatal hernia, PUD,GERD  ,,  Endo/Other  diabetes    Renal/GU Renal disease  negative genitourinary   Musculoskeletal  (+) Arthritis ,    Abdominal   Peds negative pediatric ROS (+)  Hematology negative hematology ROS (+)   Anesthesia Other Findings BPH (benign prostatic hyperplasia) History of chicken pox Diabetes mellitus without complication (HCC) Dysphagia Environmental allergies  ED (erectile dysfunction) GERD (gastroesophageal reflux disease) Glaucoma (increased eye pressure) History of colonic polyps Multiple gastric ulcers Hyperlipidemia Obesity Arthritis  Dysrhythmia Sleep apnea intolerant of CPAP History of kidney stones Bronchitis  Cancer (HCC) History of  hiatal hernia  Lower back pain Atrial fibrillation (HCC)  Hypertension Sleep apnea, unable to tolerate CPAP, discussed risks untreated sleep apnea, including tendency to atrial fibrillation, discussed possible different modalities for treatment of sleep apnea, urged patient to have updated sleep study and discuss with primary care physician. He said he will "think about it."    Reproductive/Obstetrics negative OB ROS                              Anesthesia Physical Anesthesia Plan  ASA: 3  Anesthesia Plan: MAC   Post-op Pain Management:    Induction: Intravenous  PONV Risk Score and Plan:   Airway Management Planned: Natural Airway and Nasal Cannula  Additional Equipment:   Intra-op Plan:   Post-operative Plan:   Informed Consent: I have reviewed the patients History and Physical, chart, labs and discussed the procedure including the risks, benefits and alternatives for the proposed anesthesia with the patient or authorized representative who has indicated his/her understanding and acceptance.     Dental Advisory Given  Plan Discussed with: Anesthesiologist, CRNA and Surgeon  Anesthesia Plan Comments: (Patient consented for risks of anesthesia including but not limited to:  - adverse reactions to medications - damage to eyes, teeth, lips or other oral mucosa - nerve damage due to positioning  - sore throat or hoarseness - Damage to heart, brain, nerves, lungs, other parts of body or loss of life  Patient voiced understanding.)        Anesthesia Quick Evaluation

## 2023-04-04 NOTE — Discharge Instructions (Signed)

## 2023-04-05 ENCOUNTER — Ambulatory Visit: Payer: Medicare HMO | Admitting: Anesthesiology

## 2023-04-05 ENCOUNTER — Other Ambulatory Visit: Payer: Self-pay

## 2023-04-05 ENCOUNTER — Encounter
Admission: RE | Disposition: A | Discharge status: Home / Self Care | Payer: Self-pay | Source: Home / Self Care | Attending: Ophthalmology

## 2023-04-05 ENCOUNTER — Encounter: Payer: Self-pay | Admitting: Ophthalmology

## 2023-04-05 ENCOUNTER — Ambulatory Visit
Admission: RE | Admit: 2023-04-05 | Discharge: 2023-04-05 | Disposition: A | Discharge status: Home / Self Care | Payer: Medicare HMO | Attending: Ophthalmology | Admitting: Ophthalmology

## 2023-04-05 DIAGNOSIS — N400 Enlarged prostate without lower urinary tract symptoms: Secondary | ICD-10-CM | POA: Insufficient documentation

## 2023-04-05 DIAGNOSIS — E1136 Type 2 diabetes mellitus with diabetic cataract: Secondary | ICD-10-CM | POA: Insufficient documentation

## 2023-04-05 DIAGNOSIS — G4730 Sleep apnea, unspecified: Secondary | ICD-10-CM | POA: Diagnosis not present

## 2023-04-05 DIAGNOSIS — K449 Diaphragmatic hernia without obstruction or gangrene: Secondary | ICD-10-CM | POA: Insufficient documentation

## 2023-04-05 DIAGNOSIS — Z7901 Long term (current) use of anticoagulants: Secondary | ICD-10-CM | POA: Diagnosis not present

## 2023-04-05 DIAGNOSIS — Z7984 Long term (current) use of oral hypoglycemic drugs: Secondary | ICD-10-CM | POA: Insufficient documentation

## 2023-04-05 DIAGNOSIS — K219 Gastro-esophageal reflux disease without esophagitis: Secondary | ICD-10-CM | POA: Insufficient documentation

## 2023-04-05 DIAGNOSIS — I10 Essential (primary) hypertension: Secondary | ICD-10-CM | POA: Insufficient documentation

## 2023-04-05 DIAGNOSIS — H2511 Age-related nuclear cataract, right eye: Secondary | ICD-10-CM | POA: Diagnosis not present

## 2023-04-05 DIAGNOSIS — I4891 Unspecified atrial fibrillation: Secondary | ICD-10-CM | POA: Insufficient documentation

## 2023-04-05 HISTORY — DX: Unspecified atrial fibrillation: I48.91

## 2023-04-05 HISTORY — DX: Essential (primary) hypertension: I10

## 2023-04-05 HISTORY — PX: CATARACT EXTRACTION W/PHACO: SHX586

## 2023-04-05 LAB — GLUCOSE, CAPILLARY: Glucose-Capillary: 121 mg/dL — ABNORMAL HIGH (ref 70–99)

## 2023-04-05 SURGERY — PHACOEMULSIFICATION, CATARACT, WITH IOL INSERTION
Anesthesia: Monitor Anesthesia Care | Site: Eye | Laterality: Right

## 2023-04-05 MED ORDER — TETRACAINE HCL 0.5 % OP SOLN
OPHTHALMIC | Status: AC
  Filled 2023-04-05: qty 4

## 2023-04-05 MED ORDER — MIDAZOLAM HCL 2 MG/2ML IJ SOLN
INTRAMUSCULAR | Status: AC
  Filled 2023-04-05: qty 2

## 2023-04-05 MED ORDER — ARMC OPHTHALMIC DILATING DROPS
1.0000 | OPHTHALMIC | Status: DC | PRN
  Administered 2023-04-05 (×3): 1 via OPHTHALMIC

## 2023-04-05 MED ORDER — FENTANYL CITRATE (PF) 100 MCG/2ML IJ SOLN
INTRAMUSCULAR | Status: AC
  Filled 2023-04-05: qty 2

## 2023-04-05 MED ORDER — FENTANYL CITRATE (PF) 100 MCG/2ML IJ SOLN
INTRAMUSCULAR | Status: DC | PRN
  Administered 2023-04-05: 50 ug via INTRAVENOUS

## 2023-04-05 MED ORDER — TETRACAINE HCL 0.5 % OP SOLN
1.0000 [drp] | OPHTHALMIC | Status: DC | PRN
  Administered 2023-04-05 (×3): 1 [drp] via OPHTHALMIC

## 2023-04-05 MED ORDER — ARMC OPHTHALMIC DILATING DROPS
OPHTHALMIC | Status: AC
  Filled 2023-04-05: qty 0.5

## 2023-04-05 MED ORDER — MIDAZOLAM HCL 2 MG/2ML IJ SOLN
INTRAMUSCULAR | Status: DC | PRN
  Administered 2023-04-05 (×2): 1 mg via INTRAVENOUS

## 2023-04-05 MED ORDER — SIGHTPATH DOSE#1 BSS IO SOLN
INTRAOCULAR | Status: DC | PRN
  Administered 2023-04-05: 84 mL via OPHTHALMIC

## 2023-04-05 MED ORDER — SIGHTPATH DOSE#1 BSS IO SOLN
INTRAOCULAR | Status: DC | PRN
  Administered 2023-04-05: 15 mL

## 2023-04-05 MED ORDER — CEFUROXIME OPHTHALMIC INJECTION 1 MG/0.1 ML
INJECTION | OPHTHALMIC | Status: DC | PRN
  Administered 2023-04-05: .1 mL via INTRACAMERAL

## 2023-04-05 MED ORDER — SIGHTPATH DOSE#1 BSS IO SOLN
INTRAOCULAR | Status: DC | PRN
  Administered 2023-04-05: 1 mL via INTRAMUSCULAR

## 2023-04-05 MED ORDER — SIGHTPATH DOSE#1 NA HYALUR & NA CHOND-NA HYALUR IO KIT
PACK | INTRAOCULAR | Status: DC | PRN
  Administered 2023-04-05: 1 via OPHTHALMIC

## 2023-04-05 MED ORDER — LACTATED RINGERS IV SOLN: INTRAVENOUS | Status: DC

## 2023-04-05 SURGICAL SUPPLY — 10 items
CATARACT SUITE SIGHTPATH (MISCELLANEOUS) ×1
FEE CATARACT SUITE SIGHTPATH (MISCELLANEOUS) ×1 IMPLANT
GLOVE SRG 8 PF TXTR STRL LF DI (GLOVE) ×1 IMPLANT
GLOVE SURG ENC TEXT LTX SZ7.5 (GLOVE) ×1 IMPLANT
GLOVE SURG UNDER POLY LF SZ8 (GLOVE) ×1
LENS CLAREON VIVITY CNWET0 16 ×1 IMPLANT
LENS IOL CLRN VT YLW 16.0 IMPLANT
NDL FILTER BLUNT 18X1 1/2 (NEEDLE) ×1 IMPLANT
NEEDLE FILTER BLUNT 18X1 1/2 (NEEDLE) ×1
SYR 3ML LL SCALE MARK (SYRINGE) ×1 IMPLANT

## 2023-04-05 NOTE — Op Note (Signed)
LOCATION:  [facility]   PREOPERATIVE DIAGNOSIS:    Nuclear sclerotic cataract right eye. H25.11   POSTOPERATIVE DIAGNOSIS:  Nuclear sclerotic cataract right eye.     PROCEDURE:  Phacoemusification with posterior chamber intraocular lens placement of the right eye   ULTRASOUND TIME: Procedure(s) with comments: CATARACT EXTRACTION PHACO AND INTRAOCULAR LENS PLACEMENT (IOC) RIGHT CLAREON VIVITY  12.12  01:04.5 (Right) - Diabetic  LENS:   Implant Name Type Inv. Item Serial No. Manufacturer Lot No. LRB No. Used Action  LENS CLAREON VIVITY [id number] - [id number]  LENS CLAREON VIVITY [id number] 16 [id number] [employer]  Right 1 Implanted         SURGEON:  Deirdre Evener, MD   ANESTHESIA:  Topical with tetracaine drops and 2% Xylocaine jelly, augmented with 1% preservative-free intracameral lidocaine.    COMPLICATIONS:  None.   DESCRIPTION OF PROCEDURE:  The patient was identified in the holding room and transported to the operating room and placed in the supine position under the operating microscope.  The right eye was identified as the operative eye and it was prepped and draped in the usual sterile ophthalmic fashion.   A 1 millimeter clear-corneal paracentesis was made at the 12:00 position.  0.5 ml of preservative-free 1% lidocaine was injected into the anterior chamber. The anterior chamber was filled with Viscoat viscoelastic.  A 2.4 millimeter keratome was used to make a near-clear corneal incision at the 9:00 position.  A curvilinear capsulorrhexis was made with a cystotome and capsulorrhexis forceps.  Balanced salt solution was used to hydrodissect and hydrodelineate the nucleus.   Phacoemulsification was then used in stop and chop fashion to remove the lens nucleus and epinucleus.  The remaining cortex was then removed using the irrigation and aspiration handpiece. Provisc was then placed into the capsular bag to distend it for lens placement.  A lens was then  injected into the capsular bag.  The remaining viscoelastic was aspirated.   Wounds were hydrated with balanced salt solution.  The anterior chamber was inflated to a physiologic pressure with balanced salt solution.  No wound leaks were noted. Cefuroxime 0.1 ml of a 10mg /ml solution was injected into the anterior chamber for a dose of 1 mg of intracameral antibiotic at the completion of the case. The patient was taken to the recovery room in stable condition without complications of anesthesia or surgery.   Corey [name] 04/05/2023, 9:18 AM

## 2023-04-05 NOTE — Anesthesia Postprocedure Evaluation (Signed)
Anesthesia Post Note  Patient: Corey [name]  Procedure(s) Performed: CATARACT EXTRACTION PHACO AND INTRAOCULAR LENS PLACEMENT (IOC) RIGHT CLAREON VIVITY  12.12  01:04.5 (Right: Eye)  Patient location during evaluation: PACU Anesthesia Type: MAC Level of consciousness: awake and alert Pain management: pain level controlled Vital Signs Assessment: post-procedure vital signs reviewed and stable Respiratory status: spontaneous breathing, nonlabored ventilation, respiratory function stable and patient connected to nasal cannula oxygen Cardiovascular status: stable and blood pressure returned to baseline Postop Assessment: no apparent nausea or vomiting Anesthetic complications: no   No notable events documented.   Last Vitals:  Vitals:   04/05/23 0920 04/05/23 0925  BP: (!) 103/52 (!) 103/52  Pulse: 62 62  Resp: 19 (!) 23  Temp: 36.5 C 36.5 C  SpO2: 95% 94%    Last Pain:  Vitals:   04/05/23 0925  TempSrc:   PainSc: 0-No pain                 Marisue Humble

## 2023-04-05 NOTE — Transfer of Care (Signed)
Immediate Anesthesia Transfer of Care Note  Patient: Corey [name]  Procedure(s) Performed: CATARACT EXTRACTION PHACO AND INTRAOCULAR LENS PLACEMENT (IOC) RIGHT CLAREON VIVITY  12.12  01:04.5 (Right: Eye)  Patient Location: PACU  Anesthesia Type: MAC  Level of Consciousness: awake, alert  and patient cooperative  Airway and Oxygen Therapy: Patient Spontanous Breathing and Patient connected to supplemental oxygen  Post-op Assessment: Post-op Vital signs reviewed, Patient's Cardiovascular Status Stable, Respiratory Function Stable, Patent Airway and No signs of Nausea or vomiting  Post-op Vital Signs: Reviewed and stable  Complications: No notable events documented.

## 2023-04-05 NOTE — H&P (Signed)
[facility]   Primary Care Physician:  Marguarite Arbour, MD Ophthalmologist: Dr. Lockie Mola  Pre-Procedure History & Physical: HPI:  Corey [name] is a 82 y.o. male here for ophthalmic surgery.   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   Diabetes mellitus without complication (HCC)    Dysphagia    Dysrhythmia    A fib   ED (erectile dysfunction)    Environmental allergies    GERD (gastroesophageal reflux disease)    Glaucoma (increased eye pressure)    History of chicken pox    History of colonic polyps    History of hiatal hernia    History of kidney stones    h/o   Hyperlipidemia    Hypertension    Lower back pain    GOES INTO RIGHT HIP   Multiple gastric ulcers    Obesity    Sleep apnea    does not use cpap    Past Surgical History:  Procedure Laterality Date   COLONOSCOPY     COLONOSCOPY WITH PROPOFOL N/A 12/25/2017   Procedure: COLONOSCOPY WITH PROPOFOL;  Surgeon: Scot Jun, MD;  Location: [facility] ENDOSCOPY;  Service: Endoscopy;  Laterality: N/A;   ESOPHAGOGASTRODUODENOSCOPY     ESOPHAGOGASTRODUODENOSCOPY (EGD) WITH PROPOFOL N/A 12/25/2017   Procedure: ESOPHAGOGASTRODUODENOSCOPY (EGD) WITH PROPOFOL;  Surgeon: Scot Jun, MD;  Location: [facility] ENDOSCOPY;  Service: Endoscopy;  Laterality: N/A;   EXCISION OF CANCER FROM BACK     NOSE SURGERY  1970'S   BROKEN NOSE    Prior to Admission medications   Medication Sig Start Date End Date Taking? Authorizing Provider  allopurinol (ZYLOPRIM) 100 MG tablet Take 100 mg by mouth daily at 3 pm.    Yes [provider]  calcium carbonate (OS-CAL - DOSED IN MG OF ELEMENTAL CALCIUM) 1250 (500 Ca) MG tablet Take 1 tablet by mouth. 1000mg  daily   Yes [provider]  glimepiride (AMARYL) 2 MG tablet Take 4  mg by mouth daily with breakfast.   Yes [provider]  hydrochlorothiazide (HYDRODIURIL) 25 MG tablet Take 25 mg by mouth daily at 3 pm.    Yes [provider]  loratadine (CLARITIN) 10 MG tablet Take 10 mg by mouth daily.   Yes [provider]  magnesium oxide (MAG-OX) 400 (240 Mg) MG tablet Take 400 mg by mouth daily.   Yes [provider]  omeprazole (PRILOSEC) 20 MG capsule Take 20 mg by mouth daily before breakfast.    Yes [provider]  potassium bicarbonate (K-LYTE) 25 MEQ disintegrating tablet Take 25 mEq by mouth 2 (two) times daily.   Yes [provider]  rivaroxaban (XARELTO) 20 MG TABS tablet Take 20 mg by mouth daily with breakfast.    Yes [provider]  rosuvastatin (CRESTOR) 10 MG tablet Take 10 mg by mouth Nightly.   Yes [provider]  tamsulosin (FLOMAX) 0.4 MG CAPS capsule Take 2 capsules (0.8 mg total) by mouth daily after supper. 10/17/19  Yes Stoioff, Verna Czech, MD  zinc gluconate 50 MG tablet Take 50 mg by mouth daily.   Yes [provider]    Allergies as of 03/20/2023   (No Known Allergies)    History reviewed. No pertinent family history.  Social History   Socioeconomic History   Marital status: Married    Spouse name: Not on file   Number of children: Not on file   Years of education: Not on file   Highest education level: Not on file  Occupational History   Not on file  Tobacco Use   Smoking status: Never   Smokeless tobacco: Never  Vaping Use   Vaping status: Never Used  Substance and Sexual Activity   Alcohol use: Never   Drug use: Never   Sexual activity: Yes    Birth control/protection: None  Other Topics Concern   Not on file  Social History Narrative   Not on file   Social Determinants of Health   Financial Resource Strain: Not on file  Food Insecurity: Not on file  Transportation Needs: Not on file  Physical Activity: Not on file  Stress: Not on file   Social Connections: Not on file  Intimate Partner Violence: Not on file    Review of Systems: See HPI, otherwise negative ROS  Physical Exam: BP 134/73   Temp 97.9 F (36.6 C) (Temporal)   Resp 12   Ht 5\' 9"  (1.753 m)   Wt 104.6 kg   SpO2 96%   BMI 34.04 kg/m  General:   Alert,  pleasant and cooperative in NAD Head:  Normocephalic and atraumatic. Lungs:  Clear to auscultation.    Heart:  Regular rate and rhythm.   Impression/Plan: Corey [name] is here for ophthalmic surgery.  Risks, benefits, limitations, and alternatives regarding ophthalmic surgery have been reviewed with the patient.  Questions have been answered.  All parties agreeable.   Lockie Mola, MD  04/05/2023, 8:06 AM

## 2023-05-25 ENCOUNTER — Encounter: Payer: Self-pay | Admitting: Gastroenterology

## 2023-07-13 DIAGNOSIS — Z0000 Encounter for general adult medical examination without abnormal findings: Secondary | ICD-10-CM | POA: Diagnosis not present

## 2023-07-13 DIAGNOSIS — E66812 Obesity, class 2: Secondary | ICD-10-CM | POA: Diagnosis not present

## 2023-07-13 DIAGNOSIS — I480 Paroxysmal atrial fibrillation: Secondary | ICD-10-CM | POA: Diagnosis not present

## 2023-07-13 DIAGNOSIS — Z79899 Other long term (current) drug therapy: Secondary | ICD-10-CM | POA: Diagnosis not present

## 2023-07-13 DIAGNOSIS — E118 Type 2 diabetes mellitus with unspecified complications: Secondary | ICD-10-CM | POA: Diagnosis not present

## 2023-07-13 DIAGNOSIS — I10 Essential (primary) hypertension: Secondary | ICD-10-CM | POA: Diagnosis not present

## 2023-07-13 DIAGNOSIS — E782 Mixed hyperlipidemia: Secondary | ICD-10-CM | POA: Diagnosis not present

## 2023-07-13 DIAGNOSIS — Z125 Encounter for screening for malignant neoplasm of prostate: Secondary | ICD-10-CM | POA: Diagnosis not present

## 2023-07-14 DIAGNOSIS — I10 Essential (primary) hypertension: Secondary | ICD-10-CM | POA: Diagnosis not present

## 2023-07-14 DIAGNOSIS — E118 Type 2 diabetes mellitus with unspecified complications: Secondary | ICD-10-CM | POA: Diagnosis not present

## 2023-07-14 DIAGNOSIS — Z125 Encounter for screening for malignant neoplasm of prostate: Secondary | ICD-10-CM | POA: Diagnosis not present

## 2023-07-14 DIAGNOSIS — Z79899 Other long term (current) drug therapy: Secondary | ICD-10-CM | POA: Diagnosis not present

## 2023-07-14 DIAGNOSIS — E782 Mixed hyperlipidemia: Secondary | ICD-10-CM | POA: Diagnosis not present

## 2023-08-05 ENCOUNTER — Emergency Department: Payer: PPO

## 2023-08-05 ENCOUNTER — Encounter: Payer: Self-pay | Admitting: Intensive Care

## 2023-08-05 ENCOUNTER — Emergency Department
Admission: EM | Admit: 2023-08-05 | Discharge: 2023-08-05 | Disposition: A | Discharge status: Home / Self Care | Payer: PPO | Attending: Emergency Medicine | Admitting: Emergency Medicine

## 2023-08-05 ENCOUNTER — Other Ambulatory Visit: Payer: Self-pay

## 2023-08-05 DIAGNOSIS — Z7901 Long term (current) use of anticoagulants: Secondary | ICD-10-CM | POA: Insufficient documentation

## 2023-08-05 DIAGNOSIS — I10 Essential (primary) hypertension: Secondary | ICD-10-CM | POA: Diagnosis not present

## 2023-08-05 DIAGNOSIS — L03115 Cellulitis of right lower limb: Secondary | ICD-10-CM | POA: Diagnosis not present

## 2023-08-05 DIAGNOSIS — M79604 Pain in right leg: Secondary | ICD-10-CM | POA: Diagnosis not present

## 2023-08-05 DIAGNOSIS — E119 Type 2 diabetes mellitus without complications: Secondary | ICD-10-CM | POA: Diagnosis not present

## 2023-08-05 DIAGNOSIS — R600 Localized edema: Secondary | ICD-10-CM | POA: Diagnosis not present

## 2023-08-05 DIAGNOSIS — M7989 Other specified soft tissue disorders: Secondary | ICD-10-CM | POA: Diagnosis not present

## 2023-08-05 LAB — CBC WITH DIFFERENTIAL/PLATELET
Abs Immature Granulocytes: 0.05 10*3/uL (ref 0.00–0.07)
Basophils Absolute: 0 10*3/uL (ref 0.0–0.1)
Basophils Relative: 0 %
Eosinophils Absolute: 0.1 10*3/uL (ref 0.0–0.5)
Eosinophils Relative: 2 %
HCT: 45.4 % (ref 39.0–52.0)
Hemoglobin: 15.1 g/dL (ref 13.0–17.0)
Immature Granulocytes: 1 %
Lymphocytes Relative: 17 %
Lymphs Abs: 1.3 10*3/uL (ref 0.7–4.0)
MCH: 29.5 pg (ref 26.0–34.0)
MCHC: 33.3 g/dL (ref 30.0–36.0)
MCV: 88.8 fL (ref 80.0–100.0)
Monocytes Absolute: 0.8 10*3/uL (ref 0.1–1.0)
Monocytes Relative: 10 %
Neutro Abs: 5.6 10*3/uL (ref 1.7–7.7)
Neutrophils Relative %: 70 %
Platelets: 288 10*3/uL (ref 150–400)
RBC: 5.11 MIL/uL (ref 4.22–5.81)
RDW: 13.8 % (ref 11.5–15.5)
WBC: 7.8 10*3/uL (ref 4.0–10.5)
nRBC: 0 % (ref 0.0–0.2)

## 2023-08-05 LAB — BRAIN NATRIURETIC PEPTIDE: B Natriuretic Peptide: 56.4 pg/mL (ref 0.0–100.0)

## 2023-08-05 LAB — COMPREHENSIVE METABOLIC PANEL
ALT: 22 U/L (ref 0–44)
AST: 26 U/L (ref 15–41)
Albumin: 4.1 g/dL (ref 3.5–5.0)
Alkaline Phosphatase: 60 U/L (ref 38–126)
Anion gap: 13 (ref 5–15)
BUN: 19 mg/dL (ref 8–23)
CO2: 26 mmol/L (ref 22–32)
Calcium: 9.4 mg/dL (ref 8.9–10.3)
Chloride: 101 mmol/L (ref 98–111)
Creatinine, Ser: 1 mg/dL (ref 0.61–1.24)
GFR, Estimated: >60 mL/min (ref >60)
Glucose, Bld: 110 mg/dL — ABNORMAL HIGH (ref 70–99)
Potassium: 3.4 mmol/L — ABNORMAL LOW (ref 3.5–5.1)
Sodium: 140 mmol/L (ref 135–145)
Total Bilirubin: 1.2 mg/dL (ref 0.0–1.2)
Total Protein: 7.6 g/dL (ref 6.5–8.1)

## 2023-08-05 MED ORDER — CEPHALEXIN 500 MG PO CAPS: 500.0000 mg | ORAL_CAPSULE | Freq: Three times a day (TID) | ORAL | 0 refills | Status: AC

## 2023-08-05 MED ORDER — MEDICAL COMPRESSION STOCKINGS MISC: 0 refills | Status: AC

## 2023-08-05 MED ORDER — CEPHALEXIN 500 MG PO CAPS
500.0000 mg | ORAL_CAPSULE | Freq: Once | ORAL | Status: AC
  Administered 2023-08-05: 500 mg via ORAL
  Filled 2023-08-05: qty 1

## 2023-08-05 MED ORDER — MEDICAL COMPRESSION STOCKINGS MISC: 0 refills | Status: DC

## 2023-08-05 NOTE — ED Provider Triage Note (Signed)
Emergency Medicine Provider Triage Evaluation Note  Corey [name] , a 83 y.o. male  was evaluated in triage.  Pt complains of right leg swelling and pain.  Review of Systems  Positive:  Negative:   Physical Exam  BP (!) 105/54 (BP Location: Right Arm)   Pulse 77   Temp 97.8 F (36.6 C) (Oral)   Resp 18   Ht 5\' 9"  (1.753 m)   Wt 104.3 kg   SpO2 96%   BMI 33.97 kg/m  Gen:   Awake, no distress   Resp:  Normal effort  MSK:   Moves extremities without difficulty, right lower leg swollen tender with some discoloration Other:    Medical Decision Making  Medically screening exam initiated at 10:40 AM.  Appropriate orders placed.  Jerol Casimiro Needle was informed that the remainder of the evaluation will be completed by another provider, this initial triage assessment does not replace that evaluation, and the importance of remaining in the ED until their evaluation is complete.  Ultrasound right lower extremity for DVT   Faythe Ghee, PA-C 08/05/23 1041

## 2023-08-05 NOTE — ED Triage Notes (Addendum)
Patient presents with leg pain and swelling X2 days. Reports foot is tender and sore.   Patient takes xarelto daily

## 2023-08-05 NOTE — ED Provider Notes (Signed)
   [facility] Provider Note    Event Date/Time   First MD Initiated Contact with Patient 08/05/23 1414     (approximate)  History   Chief Complaint: Leg Swelling  HPI  Denvil HERU MONTZ is a 83 y.o. male with a past medical history of arthritis, diabetes, gastric reflux, hypertension, hyperlipidemia, paroxysmal atrial fibrillation on Xarelto who presents to the emergency department for right leg swelling.  According to the patient for the past 2 days he has noticed swelling and discomfort in his right lower extremity and right foot.  States mild redness to the area as well.  Patient denies any fever.   Physical Exam   Triage Vital Signs: ED Triage Vitals  Encounter Vitals Group     BP 08/05/23 1038 (!) 105/54     Systolic BP Percentile --      Diastolic BP Percentile --      Pulse Rate 08/05/23 1038 77     Resp 08/05/23 1038 18     Temp 08/05/23 1038 97.8 F (36.6 C)     Temp Source 08/05/23 1038 Oral     SpO2 08/05/23 1038 96 %     Weight 08/05/23 1039 230 lb (104.3 kg)     Height 08/05/23 1039 5\' 9"  (1.753 m)     Head Circumference --      Peak Flow --      Pain Score 08/05/23 1038 6     Pain Loc --      Pain Education --      Exclude from Growth Chart --     Most recent vital signs: Vitals:   08/05/23 1038  BP: (!) 105/54  Pulse: 77  Resp: 18  Temp: 97.8 F (36.6 C)  SpO2: 96%    General: Awake, no distress.  CV:  Good peripheral perfusion. Resp:  Normal effort.   Abd:  No distention.  Other:  Patient has mild edema of the left lower extremity and 1+ edema in the right lower extremity that is mildly pitting.  Mild erythema to the right lower extremity mostly over the dorsal and plantar aspect of the foot.  No abscess.  Mild tenderness to palpation over this area.   ED Results / Procedures / Treatments   RADIOLOGY  Ultrasound negative for DVT   MEDICATIONS ORDERED IN ED: Medications  cephALEXin (KEFLEX) capsule 500 mg (has no  administration in time range)     IMPRESSION / MDM / ASSESSMENT AND PLAN / ED COURSE  I reviewed the triage vital signs and the nursing notes.  Patient's presentation is most consistent with acute presentation with potential threat to life or bodily function.  Patient presents to the emergency department for concerns of right lower extremity swelling and discomfort ongoing over the past 2 days.  Patient is on Xarelto for paroxysmal atrial fibrillation.  Patient's workup in the emergency department shows reassuring results reassuring CBC reassuring chemistry BNP is normal.  Patient's ultrasound shows no DVT.  We will cover with antibiotics for likely cellulitis we will prescribe compression stockings and have the patient follow-up with his PCP on Monday or Tuesday.  Patient agreeable to plan of care.  FINAL CLINICAL IMPRESSION(S) / ED DIAGNOSES   Cellulitis Peripheral edema   Note:  This document was prepared using Dragon voice recognition software and may include unintentional dictation errors.   Minna Antis, MD 08/05/23 [phone]

## 2023-08-05 NOTE — ED Notes (Signed)
First nurse note: from [facility] sent for R calf redness warmth and swelling since yesterday. No SOB.

## 2023-08-15 DIAGNOSIS — L03115 Cellulitis of right lower limb: Secondary | ICD-10-CM | POA: Diagnosis not present

## 2023-08-15 DIAGNOSIS — M542 Cervicalgia: Secondary | ICD-10-CM | POA: Diagnosis not present

## 2023-08-15 DIAGNOSIS — R519 Headache, unspecified: Secondary | ICD-10-CM | POA: Diagnosis not present

## 2023-08-15 DIAGNOSIS — M7989 Other specified soft tissue disorders: Secondary | ICD-10-CM | POA: Diagnosis not present

## 2023-08-17 ENCOUNTER — Other Ambulatory Visit: Payer: Self-pay | Admitting: Internal Medicine

## 2023-08-17 DIAGNOSIS — R519 Headache, unspecified: Secondary | ICD-10-CM

## 2023-08-17 DIAGNOSIS — L03115 Cellulitis of right lower limb: Secondary | ICD-10-CM

## 2023-08-17 DIAGNOSIS — M542 Cervicalgia: Secondary | ICD-10-CM

## 2023-08-23 ENCOUNTER — Encounter: Payer: Self-pay | Admitting: Gastroenterology

## 2023-08-23 NOTE — H&P (Signed)
Pre-Procedure H&P   Patient ID: Corey [name] is a 83 y.o. male.  Gastroenterology Provider: Jaynie Collins, DO  Referring Provider: Fransico Setters, NP PCP: Marguarite Arbour, MD  Date: 08/24/2023  HPI [name] is a 83 y.o. male who presents today for Esophagogastroduodenoscopy and Colonoscopy for GERD, dysphagia, Schatzki's ring, personal history of colon polyps .  Patient last underwent colonoscopy in June 2019 with 1 adenomatous polyp.  Pandiverticulosis.  Also had an adenomatous polyp in 2014.  EGD in 2014 demonstrated hiatal hernia and Schatzki's ring.  He has noted increasing dysphagia with midsternal Hanging of food.  No issues with liquids or pills no odynophagia.  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   Diabetes mellitus without complication (HCC)    Dysphagia    Dysrhythmia    A fib   ED (erectile dysfunction)    Environmental allergies    Environmental allergies    GERD (gastroesophageal reflux disease)    Glaucoma (increased eye pressure)    History of chicken pox    History of colonic polyps    History of gastric ulcer    History of hiatal hernia    History of kidney stones    h/o   Hyperlipidemia    Hypertension    Lower back pain    GOES INTO RIGHT HIP   Multiple gastric ulcers    Nephrolithiasis    Obesity    Osteoarthritis    PAF (paroxysmal atrial fibrillation) (HCC)    Scrotal varices    Sleep apnea    does not use cpap   Sleep apnea     Past Surgical History:  Procedure Laterality Date   CATARACT EXTRACTION W/PHACO Right 04/05/2023   Procedure: CATARACT EXTRACTION PHACO AND  INTRAOCULAR LENS PLACEMENT (IOC) RIGHT CLAREON VIVITY  12.12  01:04.5;  Surgeon: Lockie Mola, MD;  Location: [facility];  Service: Ophthalmology;  Laterality: Right;  Diabetic   CHOLECYSTECTOMY     COLONOSCOPY     COLONOSCOPY WITH PROPOFOL N/A 12/25/2017   Procedure: COLONOSCOPY WITH PROPOFOL;  Surgeon: Scot Jun, MD;  Location: [facility];  Service: Endoscopy;  Laterality: N/A;   ESOPHAGOGASTRODUODENOSCOPY     ESOPHAGOGASTRODUODENOSCOPY (EGD) WITH PROPOFOL N/A 12/25/2017   Procedure: ESOPHAGOGASTRODUODENOSCOPY (EGD) WITH PROPOFOL;  Surgeon: Scot Jun, MD;  Location: [facility] ENDOSCOPY;  Service: Endoscopy;  Laterality: N/A;   EXCISION OF CANCER FROM BACK     NOSE SURGERY  1970'S   BROKEN NOSE    Family History No h/o GI disease or malignancy  Review of Systems  Constitutional:  Negative for activity change, appetite change, chills, diaphoresis, fatigue, fever and unexpected weight change.  HENT:  Positive for trouble swallowing. Negative for voice change.   Respiratory:  Negative for shortness of breath and wheezing.   Cardiovascular:  Negative for chest pain, palpitations and leg swelling.  Gastrointestinal:  Negative for abdominal distention, abdominal pain, anal bleeding, blood in stool, constipation, diarrhea, nausea and vomiting.  Musculoskeletal:  Negative for arthralgias and myalgias.  Skin:  Negative for color change and pallor.  Neurological:  Negative for dizziness, syncope and weakness.  Psychiatric/Behavioral:  Negative for confusion. The patient is not nervous/anxious.   All other systems reviewed and are negative.    Medications No current facility-administered medications on file prior to encounter.   Current Outpatient Medications on File Prior to Encounter  Medication Sig Dispense Refill   cholecalciferol (VITAMIN D3) 10 MCG (400 UNIT) TABS tablet Take 400 Units by mouth daily.     Ipratropium-Albuterol (COMBIVENT RESPIMAT) 20-100  MCG/ACT AERS respimat Inhale 1 puff into the lungs every 6 (six) hours.     loratadine-pseudoephedrine (CLARITIN-D 24-HOUR) 10-240 MG 24 hr tablet Take 1 tablet by mouth daily.     sildenafil (REVATIO) 20 MG tablet Take 20 mg by mouth 3 (three) times daily. 2-5 tablets as needed 1 hour prior to intercourse     allopurinol (ZYLOPRIM) 100 MG tablet Take 100 mg by mouth daily at 3 pm.      glimepiride (AMARYL) 2 MG tablet Take 4 mg by mouth daily with breakfast.     hydrochlorothiazide (HYDRODIURIL) 25 MG tablet Take 25 mg by mouth daily at 3 pm.      omeprazole (PRILOSEC) 20 MG capsule Take 20 mg by mouth daily before breakfast.      rivaroxaban (XARELTO) 20 MG TABS tablet Take 20 mg by mouth daily with breakfast.      tamsulosin (FLOMAX) 0.4 MG CAPS capsule Take 2 capsules (0.8 mg total) by mouth daily after supper. 60 capsule 0    Pertinent medications related to GI and procedure were reviewed by me with the patient prior to the procedure   Current Facility-Administered Medications:    0.9 %  sodium chloride infusion, , Intravenous, Continuous, Jaynie Collins, DO, Last Rate: 20 mL/hr at 08/24/23 0840, New Bag at 08/24/23 0840  sodium chloride 20 mL/hr at 08/24/23 0840       Allergies  Allergen Reactions   Alphagan [Brimonidine] Other (See Comments)    Burning eyes   Timoptic [Timolol] Other (See Comments)    Mod red burning eyes   Xalatan [Latanoprost] Other (See Comments)    Mod burning eyes   Allergies were reviewed by me prior to the procedure  Objective   Body mass index is 32.93 kg/m. Vitals:   08/24/23 0813 08/24/23 0831  BP:  122/80  Pulse:  76  Resp:  18  Temp:  (!) 96.8 F (36 C)  TempSrc:  Temporal  SpO2:  98%  Weight: 101.2 kg   Height: 5\' 9"  (1.753 m)      Physical Exam Vitals and nursing note reviewed.  Constitutional:      General: He is not in acute distress.    Appearance: Normal appearance. He is not ill-appearing, toxic-appearing or  diaphoretic.  HENT:     Head: Normocephalic and atraumatic.     Nose: Nose normal.     Mouth/Throat:     Mouth: Mucous membranes are moist.     Pharynx: Oropharynx is clear.  Eyes:     General: No scleral icterus.    Extraocular Movements: Extraocular movements intact.  Cardiovascular:     Rate and Rhythm: Normal rate and regular rhythm.     Heart sounds: Normal heart sounds. No murmur heard.    No friction rub. No gallop.  Pulmonary:     Effort: Pulmonary effort is normal. No respiratory distress.     Breath sounds: Normal breath sounds. No wheezing, rhonchi or rales.  Abdominal:     General: Bowel sounds  are normal. There is no distension.     Palpations: Abdomen is soft.     Tenderness: There is no abdominal tenderness. There is no guarding or rebound.  Musculoskeletal:     Cervical back: Neck supple.     Right lower leg: No edema.     Left lower leg: No edema.  Skin:    General: Skin is warm and dry.     Coloration: Skin is not jaundiced or pale.  Neurological:     General: No focal deficit present.     Mental Status: He is alert and oriented to person, place, and time. Mental status is at baseline.  Psychiatric:        Mood and Affect: Mood normal.        Behavior: Behavior normal.        Thought Content: Thought content normal.        Judgment: Judgment normal.      Assessment:  [name] is a 83 y.o. male  who presents today for Esophagogastroduodenoscopy and Colonoscopy for GERD, dysphagia, Schatzki's ring, personal history of colon polyps .  Plan:  Esophagogastroduodenoscopy and Colonoscopy with possible intervention today  Esophagogastroduodenoscopy and Colonoscopy with possible biopsy, control of bleeding, polypectomy, and interventions as necessary has been discussed with the patient/patient representative. Informed consent was obtained from the patient/patient representative after explaining the indication, nature, and risks of the procedure including  but not limited to death, bleeding, perforation, missed neoplasm/lesions, cardiorespiratory compromise, and reaction to medications. Opportunity for questions was given and appropriate answers were provided. Patient/patient representative has verbalized understanding is amenable to undergoing the procedure.   Jaynie Collins, DO  [facility]  Portions of the record may have been created with voice recognition software. Occasional wrong-word or 'sound-a-like' substitutions may have occurred due to the inherent limitations of voice recognition software.  Read the chart carefully and recognize, using context, where substitutions may have occurred.

## 2023-08-24 ENCOUNTER — Encounter: Payer: Self-pay | Admitting: Gastroenterology

## 2023-08-24 ENCOUNTER — Ambulatory Visit: Payer: PPO | Admitting: Anesthesiology

## 2023-08-24 ENCOUNTER — Encounter
Admission: RE | Disposition: A | Discharge status: Home / Self Care | Payer: Self-pay | Source: Home / Self Care | Attending: Gastroenterology

## 2023-08-24 ENCOUNTER — Ambulatory Visit
Admission: RE | Admit: 2023-08-24 | Discharge: 2023-08-24 | Disposition: A | Discharge status: Home / Self Care | Payer: PPO | Attending: Gastroenterology | Admitting: Gastroenterology

## 2023-08-24 DIAGNOSIS — Z860100 Personal history of colon polyps, unspecified: Secondary | ICD-10-CM | POA: Diagnosis not present

## 2023-08-24 DIAGNOSIS — D122 Benign neoplasm of ascending colon: Secondary | ICD-10-CM | POA: Insufficient documentation

## 2023-08-24 DIAGNOSIS — E119 Type 2 diabetes mellitus without complications: Secondary | ICD-10-CM | POA: Insufficient documentation

## 2023-08-24 DIAGNOSIS — K649 Unspecified hemorrhoids: Secondary | ICD-10-CM | POA: Diagnosis not present

## 2023-08-24 DIAGNOSIS — I10 Essential (primary) hypertension: Secondary | ICD-10-CM | POA: Diagnosis not present

## 2023-08-24 DIAGNOSIS — M1990 Unspecified osteoarthritis, unspecified site: Secondary | ICD-10-CM | POA: Insufficient documentation

## 2023-08-24 DIAGNOSIS — G4730 Sleep apnea, unspecified: Secondary | ICD-10-CM | POA: Insufficient documentation

## 2023-08-24 DIAGNOSIS — I480 Paroxysmal atrial fibrillation: Secondary | ICD-10-CM | POA: Diagnosis not present

## 2023-08-24 DIAGNOSIS — K635 Polyp of colon: Secondary | ICD-10-CM | POA: Diagnosis not present

## 2023-08-24 DIAGNOSIS — Z1211 Encounter for screening for malignant neoplasm of colon: Secondary | ICD-10-CM | POA: Insufficient documentation

## 2023-08-24 DIAGNOSIS — K222 Esophageal obstruction: Secondary | ICD-10-CM | POA: Insufficient documentation

## 2023-08-24 DIAGNOSIS — Z7901 Long term (current) use of anticoagulants: Secondary | ICD-10-CM | POA: Diagnosis not present

## 2023-08-24 DIAGNOSIS — K5730 Diverticulosis of large intestine without perforation or abscess without bleeding: Secondary | ICD-10-CM | POA: Diagnosis not present

## 2023-08-24 DIAGNOSIS — K640 First degree hemorrhoids: Secondary | ICD-10-CM | POA: Insufficient documentation

## 2023-08-24 DIAGNOSIS — I081 Rheumatic disorders of both mitral and tricuspid valves: Secondary | ICD-10-CM | POA: Insufficient documentation

## 2023-08-24 DIAGNOSIS — K219 Gastro-esophageal reflux disease without esophagitis: Secondary | ICD-10-CM | POA: Diagnosis not present

## 2023-08-24 DIAGNOSIS — Z79899 Other long term (current) drug therapy: Secondary | ICD-10-CM | POA: Diagnosis not present

## 2023-08-24 DIAGNOSIS — Z7984 Long term (current) use of oral hypoglycemic drugs: Secondary | ICD-10-CM | POA: Insufficient documentation

## 2023-08-24 DIAGNOSIS — R1310 Dysphagia, unspecified: Secondary | ICD-10-CM | POA: Insufficient documentation

## 2023-08-24 DIAGNOSIS — K449 Diaphragmatic hernia without obstruction or gangrene: Secondary | ICD-10-CM | POA: Diagnosis not present

## 2023-08-24 DIAGNOSIS — R0609 Other forms of dyspnea: Secondary | ICD-10-CM | POA: Insufficient documentation

## 2023-08-24 DIAGNOSIS — K224 Dyskinesia of esophagus: Secondary | ICD-10-CM | POA: Diagnosis not present

## 2023-08-24 DIAGNOSIS — D124 Benign neoplasm of descending colon: Secondary | ICD-10-CM | POA: Diagnosis not present

## 2023-08-24 DIAGNOSIS — K2289 Other specified disease of esophagus: Secondary | ICD-10-CM | POA: Diagnosis not present

## 2023-08-24 HISTORY — DX: Paroxysmal atrial fibrillation: I48.0

## 2023-08-24 HISTORY — DX: Personal history of peptic ulcer disease: Z87.11

## 2023-08-24 HISTORY — DX: Scrotal varices: I86.1

## 2023-08-24 HISTORY — PX: POLYPECTOMY: SHX5525

## 2023-08-24 HISTORY — PX: ESOPHAGOGASTRODUODENOSCOPY (EGD) WITH PROPOFOL: SHX5813

## 2023-08-24 HISTORY — DX: Calculus of kidney: N20.0

## 2023-08-24 HISTORY — DX: Unspecified osteoarthritis, unspecified site: M19.90

## 2023-08-24 HISTORY — PX: COLONOSCOPY WITH PROPOFOL: SHX5780

## 2023-08-24 LAB — GLUCOSE, CAPILLARY: Glucose-Capillary: 98 mg/dL (ref 70–99)

## 2023-08-24 SURGERY — COLONOSCOPY WITH PROPOFOL
Anesthesia: Monitor Anesthesia Care

## 2023-08-24 MED ORDER — PROPOFOL 10 MG/ML IV BOLUS
INTRAVENOUS | Status: AC
  Filled 2023-08-24: qty 20

## 2023-08-24 MED ORDER — SODIUM CHLORIDE 0.9 % IV SOLN: INTRAVENOUS | Status: DC

## 2023-08-24 MED ORDER — LIDOCAINE HCL (CARDIAC) PF 100 MG/5ML IV SOSY
PREFILLED_SYRINGE | INTRAVENOUS | Status: DC | PRN
  Administered 2023-08-24: 60 mg via INTRAVENOUS

## 2023-08-24 MED ORDER — STERILE WATER FOR IRRIGATION IR SOLN
Status: DC | PRN
  Administered 2023-08-24: 120 mL

## 2023-08-24 MED ORDER — PROPOFOL 10 MG/ML IV BOLUS
INTRAVENOUS | Status: DC | PRN
  Administered 2023-08-24: 25 mg via INTRAVENOUS
  Administered 2023-08-24: 70 mg via INTRAVENOUS
  Administered 2023-08-24: 25 mg via INTRAVENOUS
  Administered 2023-08-24: 30 mg via INTRAVENOUS
  Administered 2023-08-24 (×2): 25 mg via INTRAVENOUS
  Administered 2023-08-24: 100 ug/kg/min via INTRAVENOUS

## 2023-08-24 MED ORDER — LIDOCAINE HCL (PF) 2 % IJ SOLN
INTRAMUSCULAR | Status: AC
  Filled 2023-08-24: qty 5

## 2023-08-24 NOTE — Op Note (Signed)
 [facility] Patient Name: Corey [name] Procedure Date: 08/24/2023 8:44 AM MRN: [id number] Account #: [id number] Date of Birth: 20-Jul-1940 Admit Type: Outpatient Age: 83 Room: [facility] ENDO ROOM 1 Gender: Male Note Status: Finalized Instrument Name: Peds Colonoscope [id number] Procedure:             Colonoscopy Indications:           High risk colon cancer surveillance: Personal history                         of colonic polyps Providers:             Trenda Moots, DO Referring MD:          Duane Lope. Judithann Sheen, MD (Referring MD) Medicines:             Monitored Anesthesia Care Complications:         No immediate complications. Estimated blood loss:                         Minimal. Procedure:             Pre-Anesthesia Assessment:                        - Prior to the procedure, a History and Physical was                         performed, and patient medications and allergies were                         reviewed. The patient is competent. The risks and                         benefits of the procedure and the sedation options and                         risks were discussed with the patient. All questions                         were answered and informed consent was obtained.                         Patient identification and proposed procedure were                         verified by the physician, the nurse, the anesthetist                         and the technician in the endoscopy suite. Mental                         Status Examination: alert and oriented. Airway                         Examination: normal oropharyngeal airway and neck                         mobility. Respiratory Examination: clear to  auscultation. CV Examination: regular rate and rhythm.                         Prophylactic Antibiotics: The patient does not require                         prophylactic antibiotics. Prior Anticoagulants: The                          patient has taken Xarelto (rivaroxaban), last dose was                         4 days prior to procedure. ASA Grade Assessment: III -                         A patient with severe systemic disease. After                         reviewing the risks and benefits, the patient was                         deemed in satisfactory condition to undergo the                         procedure. The anesthesia plan was to use monitored                         anesthesia care (MAC). Immediately prior to                         administration of medications, the patient was                         re-assessed for adequacy to receive sedatives. The                         heart rate, respiratory rate, oxygen saturations,                         blood pressure, adequacy of pulmonary ventilation, and                         response to care were monitored throughout the                         procedure. The physical status of the patient was                         re-assessed after the procedure.                        After obtaining informed consent, the colonoscope was                         passed under direct vision. Throughout the procedure,                         the patient's blood pressure, pulse, and oxygen  saturations were monitored continuously. The                         Colonoscope was introduced through the anus and                         advanced to the the cecum, identified by appendiceal                         orifice and ileocecal valve. The colonoscopy was                         performed without difficulty. The patient tolerated                         the procedure well. The quality of the bowel                         preparation was evaluated using the BBPS [facility] Bowel                         Preparation Scale) with scores of: Right Colon = 2                         (minor amount of residual staining, small fragments of                          stool and/or opaque liquid, but mucosa seen well),                         Transverse Colon = 2 (minor amount of residual                         staining, small fragments of stool and/or opaque                         liquid, but mucosa seen well) and Left Colon = 2                         (minor amount of residual staining, small fragments of                         stool and/or opaque liquid, but mucosa seen well). The                         total BBPS score equals 6. The quality of the bowel                         preparation was good. The ileocecal valve, appendiceal                         orifice, and rectum were photographed. Findings:      The perianal and digital rectal examinations were normal. Pertinent       negatives include normal sphincter tone.      Retroflexion in the right colon was performed.      A 1 to 2 mm polyp was found in the  descending colon. The polyp was       sessile. The polyp was removed with a jumbo cold forceps. Resection and       retrieval were complete. Estimated blood loss was minimal.      A 3 to 4 mm polyp was found in the ascending colon. The polyp was       sessile. The polyp was removed with a cold snare. Resection and       retrieval were complete. Estimated blood loss was minimal.      Multiple small-mouthed diverticula were found in the sigmoid colon.       Estimated blood loss: none.      Non-bleeding internal hemorrhoids were found during retroflexion. The       hemorrhoids were Grade I (internal hemorrhoids that do not prolapse).       Estimated blood loss: none.      The exam was otherwise without abnormality on direct and retroflexion       views. Impression:            - One 1 to 2 mm polyp in the descending colon, removed                         with a jumbo cold forceps. Resected and retrieved.                        - One 3 to 4 mm polyp in the ascending colon, removed                         with a cold snare. Resected and  retrieved.                        - Diverticulosis in the sigmoid colon.                        - Non-bleeding internal hemorrhoids.                        - The examination was otherwise normal on direct and                         retroflexion views. Recommendation:        - Patient has a contact number available for                         emergencies. The signs and symptoms of potential                         delayed complications were discussed with the patient.                         Return to normal activities tomorrow. Written                         discharge instructions were provided to the patient.                        - Discharge patient to home.                        -  Resume previous diet.                        - Continue present medications.                        - Resume Xarelto (rivaroxaban) at prior dose in 2                         days. Refer to managing physician for further                         adjustment of therapy.                        - Await pathology results.                        - Return to GI office as previously scheduled.                        - The findings and recommendations were discussed with                         the patient. Procedure Code(s):     --- Professional ---                        [id number], Colonoscopy, flexible; with removal of                         tumor(s), polyp(s), or other lesion(s) by snare                         technique                        45380, 59, Colonoscopy, flexible; with biopsy, single                         or multiple Diagnosis Code(s):     --- Professional ---                        Z86.010, Personal history of colonic polyps                        D12.4, Benign neoplasm of descending colon                        D12.2, Benign neoplasm of ascending colon                        K64.0, First degree hemorrhoids                        K57.30, Diverticulosis of large intestine without                          perforation or abscess without bleeding CPT copyright 2022 American [employer] Association. All rights reserved. The codes documented in this report are preliminary and upon coder review may  be revised to meet current compliance requirements. Attending Participation:  I personally performed the entire procedure. Elfredia Nevins, DO Jaynie Collins DO, DO 08/24/2023 9:34:23 AM This report has been signed electronically. Number of Addenda: 0 Note Initiated On: 08/24/2023 8:44 AM Scope Withdrawal Time: 0 hours 13 minutes 3 seconds  Total Procedure Duration: 0 hours 16 minutes 50 seconds  Estimated Blood Loss:  Estimated blood loss was minimal.      [facility]

## 2023-08-24 NOTE — Anesthesia Postprocedure Evaluation (Signed)
Anesthesia Post Note  Patient: Corey [name]  Procedure(s) Performed: COLONOSCOPY WITH PROPOFOL ESOPHAGOGASTRODUODENOSCOPY (EGD) WITH PROPOFOL POLYPECTOMY  Patient location during evaluation: PACU Anesthesia Type: General Level of consciousness: awake and alert Pain management: pain level controlled Vital Signs Assessment: post-procedure vital signs reviewed and stable Respiratory status: spontaneous breathing, nonlabored ventilation and respiratory function stable Cardiovascular status: blood pressure returned to baseline and stable Postop Assessment: no apparent nausea or vomiting Anesthetic complications: no   No notable events documented.   Last Vitals:  Vitals:   08/24/23 0945 08/24/23 0948  BP: 104/67 124/71  Pulse: (!) 30 75  Resp: 15 19  Temp:    SpO2: 96% 98%    Last Pain:  Vitals:   08/24/23 0948  TempSrc:   PainSc: 0-No pain                 Foye Deer

## 2023-08-24 NOTE — Op Note (Signed)
[facility] Patient Name: Corey [name] Procedure Date: 08/24/2023 8:46 AM MRN: [id number] Account #: [id number] Date of Birth: September 27, 1940 Admit Type: Outpatient Age: 83 Room: [facility] ENDO ROOM 1 Gender: Male Note Status: Finalized Instrument Name: Upper Endoscope [id number] Procedure:             Upper GI endoscopy Indications:           Dysphagia Providers:             Trenda Moots, DO Referring MD:          Duane Lope. Judithann Sheen, MD (Referring MD) Medicines:             Monitored Anesthesia Care Complications:         No immediate complications. Estimated blood loss: None. Procedure:             Pre-Anesthesia Assessment:                        - Prior to the procedure, a History and Physical was                         performed, and patient medications and allergies were                         reviewed. The patient is competent. The risks and                         benefits of the procedure and the sedation options and                         risks were discussed with the patient. All questions                         were answered and informed consent was obtained.                         Patient identification and proposed procedure were                         verified by the physician, the nurse, the anesthetist                         and the technician in the endoscopy suite. Mental                         Status Examination: alert and oriented. Airway                         Examination: normal oropharyngeal airway and neck                         mobility. Respiratory Examination: clear to                         auscultation. CV Examination: RRR, no murmurs, no S3                         or S4. Prophylactic Antibiotics: The patient does not  require prophylactic antibiotics. Prior                         Anticoagulants: The patient has taken Xarelto                         (rivaroxaban), last dose was 4 days  prior to                         procedure. ASA Grade Assessment: III - A patient with                         severe systemic disease. After reviewing the risks and                         benefits, the patient was deemed in satisfactory                         condition to undergo the procedure. The anesthesia                         plan was to use monitored anesthesia care (MAC).                         Immediately prior to administration of medications,                         the patient was re-assessed for adequacy to receive                         sedatives. The heart rate, respiratory rate, oxygen                         saturations, blood pressure, adequacy of pulmonary                         ventilation, and response to care were monitored                         throughout the procedure. The physical status of the                         patient was re-assessed after the procedure.                        After obtaining informed consent, the endoscope was                         passed under direct vision. Throughout the procedure,                         the patient's blood pressure, pulse, and oxygen                         saturations were monitored continuously. The Endoscope                         was introduced through the mouth, and advanced to the  second part of duodenum. The upper GI endoscopy was                         accomplished without difficulty. The patient tolerated                         the procedure well. Findings:      The duodenal bulb, first portion of the duodenum and second portion of       the duodenum were normal. Estimated blood loss: none.      The entire examined stomach was normal. Estimated blood loss: none.      Esophagogastric landmarks were identified: the gastroesophageal junction       was found at 36 cm from the incisors.      The Z-line was regular. Estimated blood loss: none.      A widely patent Schatzki  ring was found at the gastroesophageal       junction. Estimated blood loss: none.      Abnormal motility was noted in the esophagus. The cricopharyngeus was       normal. There is spasticity of the esophageal body. The distal       esophagus/lower esophageal sphincter is spastic, but gives up passage to       the endoscope. Estimated blood loss: none.      The exam of the esophagus was otherwise normal. Impression:            - Normal duodenal bulb, first portion of the duodenum                         and second portion of the duodenum.                        - Normal stomach.                        - Esophagogastric landmarks identified.                        - Z-line regular.                        - Widely patent Schatzki ring.                        - Abnormal esophageal motility.                        - No specimens collected. Recommendation:        - Patient has a contact number available for                         emergencies. The signs and symptoms of potential                         delayed complications were discussed with the patient.                         Return to normal activities tomorrow. Written                         discharge instructions were provided to  the patient.                        - Discharge patient to home.                        - Resume previous diet.                        - Continue present medications.                        - Proceed with colonoscopy. see report for further                         recommendations including when to resume Xarelto.                        - Return to GI clinic as previously scheduled.                        - The findings and recommendations were discussed with                         the patient. 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 All rights reserved. The codes documented in this report are preliminary and upon coder review may  be revised to meet current compliance requirements. Attending Participation:      I personally performed the entire procedure. Elfredia Nevins, DO Jaynie Collins DO, DO 08/24/2023 9:10:42 AM This report has been signed electronically. Number of Addenda: 0 Note Initiated On: 08/24/2023 8:46 AM Estimated Blood Loss:  Estimated blood loss: none.      [facility]

## 2023-08-24 NOTE — Anesthesia Preprocedure Evaluation (Addendum)
Anesthesia Evaluation  Patient identified by MRN, date of birth, ID band Patient awake    Reviewed: Allergy & Precautions, H&P , NPO status , Patient's Chart, lab work & pertinent test results  Airway Mallampati: II  TM Distance: <3 FB Neck ROM: Full    Dental no notable dental hx. (+) Caps   Pulmonary sleep apnea  DOE, Sleep apnea, intolerant of CPAP   Pulmonary exam normal breath sounds clear to auscultation       Cardiovascular hypertension, + dysrhythmias Atrial Fibrillation  Rhythm:Regular Rate:Normal  Echocardiogram 2D complete: (02/05/2020) INTERPRETATION NORMAL LEFT VENTRICULAR SYSTOLIC FUNCTION WITH AN ESTIMATED EF = >55 % NORMAL RIGHT VENTRICULAR SYSTOLIC FUNCTION MODERATE MITRAL VALVE INSUFFICIENCY MILD TRICUSPID VALVE INSUFFICIENCY NO VALVULAR STENOSIS MODERATE LA ENLARGEMENT MILD RV ENLARGEMENT MILD RA ENLARGEMENT  NM Myocardial Perfusion SPECT multiple (stress and rest): (02/05/2020) IMPRESSION: Normal myocardial perfusion scan no evidence of stress-induced myocardial ischemia ejection fraction of 58% conclusion negative scan     Neuro/Psych negative neurological ROS  negative psych ROS   GI/Hepatic Neg liver ROS, hiatal hernia,GERD  ,,  Endo/Other  diabetes    Renal/GU Renal disease  negative genitourinary   Musculoskeletal  (+) Arthritis ,    Abdominal Normal abdominal exam  (+)   Peds negative pediatric ROS (+)  Hematology negative hematology ROS (+)   Anesthesia Other Findings BPH (benign prostatic hyperplasia) History of chicken pox Diabetes mellitus without complication (HCC) Dysphagia Environmental allergies  ED (erectile dysfunction) GERD (gastroesophageal reflux disease) Glaucoma (increased eye pressure) History of colonic polyps Multiple gastric ulcers Hyperlipidemia Obesity Arthritis  Dysrhythmia Sleep apnea intolerant of CPAP History of kidney stones Bronchitis  Cancer  (HCC) History of hiatal hernia  Lower back pain Atrial fibrillation (HCC)  Hypertension Sleep apnea, unable to tolerate CPAP, discussed risks untreated sleep apnea, including tendency to atrial fibrillation, discussed possible different modalities for treatment of sleep apnea, urged patient to have updated sleep study and discuss with primary care physician. He said he will "think about it."    Reproductive/Obstetrics negative OB ROS                             Anesthesia Physical Anesthesia Plan  ASA: 3  Anesthesia Plan: General   Post-op Pain Management:    Induction: Intravenous  PONV Risk Score and Plan: Propofol infusion  Airway Management Planned: Natural Airway  Additional Equipment:   Intra-op Plan:   Post-operative Plan:   Informed Consent: I have reviewed the patients History and Physical, chart, labs and discussed the procedure including the risks, benefits and alternatives for the proposed anesthesia with the patient or authorized representative who has indicated his/her understanding and acceptance.     Dental Advisory Given  Plan Discussed with: Anesthesiologist, CRNA and Surgeon  Anesthesia Plan Comments:         Anesthesia Quick Evaluation

## 2023-08-24 NOTE — Interval H&P Note (Signed)
History and Physical Interval Note: Preprocedure H&P from 08/24/23  was reviewed and there was no interval change after seeing and examining the patient.  Written consent was obtained from the patient after discussion of risks, benefits, and alternatives. Patient has consented to proceed with Esophagogastroduodenoscopy and Colonoscopy with possible intervention   08/24/2023 8:53 AM  Corey [name]  has presented today for surgery, with the diagnosis of 530.81 (ICD-9-CM) - K21.9 (ICD-10-CM) - Gastroesophageal reflux disease, unspecified whether esophagitis present 787.20 (ICD-9-CM) - R13.10 (ICD-10-CM) - Dysphagia, unspecified type 750.3 (ICD-9-CM) - K22.2 (ICD-10-CM) - Schatzki's ring V12.72 (ICD-9-CM) - Z86.010 (ICD-10-CM) - History of colonic polyps.  The various methods of treatment have been discussed with the patient and family. After consideration of risks, benefits and other options for treatment, the patient has consented to  Procedure(s): COLONOSCOPY WITH PROPOFOL (N/A) ESOPHAGOGASTRODUODENOSCOPY (EGD) WITH PROPOFOL (N/A) as a surgical intervention.  The patient's history has been reviewed, patient examined, no change in status, stable for surgery.  I have reviewed the patient's chart and labs.  Questions were answered to the patient's satisfaction.     Jaynie Collins

## 2023-08-24 NOTE — Transfer of Care (Signed)
Immediate Anesthesia Transfer of Care Note  Patient: Corey [name]  Procedure(s) Performed: COLONOSCOPY WITH PROPOFOL ESOPHAGOGASTRODUODENOSCOPY (EGD) WITH PROPOFOL POLYPECTOMY  Patient Location: Endoscopy Unit  Anesthesia Type:General  Level of Consciousness: drowsy  Airway & Oxygen Therapy: Patient Spontanous Breathing  Post-op Assessment: Report given to RN  Post vital signs: stable  Last Vitals:  Vitals Value Taken Time  BP 84/63 08/24/23 0938  Temp 35.6 C 08/24/23 0935  Pulse 112 08/24/23 0940  Resp 28 08/24/23 0940  SpO2 94 % 08/24/23 0940  Vitals shown include unfiled device data.  Last Pain:  Vitals:   08/24/23 0935  TempSrc: Temporal  PainSc: Asleep         Complications: No notable events documented.

## 2023-08-25 ENCOUNTER — Encounter: Payer: Self-pay | Admitting: Gastroenterology

## 2023-08-25 LAB — SURGICAL PATHOLOGY

## 2023-08-31 ENCOUNTER — Ambulatory Visit
Admission: RE | Admit: 2023-08-31 | Discharge: 2023-08-31 | Disposition: A | Discharge status: Home / Self Care | Payer: PPO | Source: Ambulatory Visit | Attending: Internal Medicine | Admitting: Internal Medicine

## 2023-08-31 DIAGNOSIS — I6782 Cerebral ischemia: Secondary | ICD-10-CM | POA: Diagnosis not present

## 2023-08-31 DIAGNOSIS — R519 Headache, unspecified: Secondary | ICD-10-CM

## 2023-08-31 DIAGNOSIS — L03115 Cellulitis of right lower limb: Secondary | ICD-10-CM | POA: Diagnosis not present

## 2023-08-31 DIAGNOSIS — M542 Cervicalgia: Secondary | ICD-10-CM | POA: Diagnosis not present

## 2023-09-06 DIAGNOSIS — I890 Lymphedema, not elsewhere classified: Secondary | ICD-10-CM | POA: Insufficient documentation

## 2023-09-06 NOTE — Progress Notes (Addendum)
 MRN : [id number]  Corey [name] is a 83 y.o. (08/23/1940) male who presents with chief complaint of legs swell.  History of Present Illness:   Patient is seen for evaluation of leg swelling. The patient first noticed the swelling remotely but is now concerned because of a significant increase in the overall edema. The swelling isn't associated with significant pain.  There has been an increasing amount of  discoloration noted by the patient. The patient notes that in the morning the legs are improved but they steadily worsened throughout the course of the day. Elevation seems to make the swelling of the legs better, dependency makes them much worse.   There is no history of ulcerations associated with the swelling.   The patient denies any recent changes in their medications.  The patient has not been wearing graduated compression.  The patient has no had any past angiography, interventions or vascular surgery.  The patient denies a history of DVT or PE. There is no prior history of phlebitis. There is no history of primary lymphedema.  There is no history of radiation treatment to the groin or pelvis No history of malignancies. No history of trauma or groin or pelvic surgery. No history of foreign travel or parasitic infections area   No outpatient medications have been marked as taking for the 09/07/23 encounter (Appointment) with Gilda Crease, Latina Craver, MD.    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   Diabetes mellitus without complication (HCC)    Dysphagia    Dysrhythmia    A fib   ED (erectile dysfunction)    Environmental allergies    Environmental allergies    GERD (gastroesophageal reflux disease)    Glaucoma (increased eye pressure)    History of chicken pox     History of colonic polyps    History of gastric ulcer    History of hiatal hernia    History of kidney stones    h/o   Hyperlipidemia    Hypertension    Lower back pain    GOES INTO RIGHT HIP   Multiple gastric ulcers    Nephrolithiasis    Obesity    Osteoarthritis    PAF (paroxysmal atrial fibrillation) (HCC)    Scrotal varices    Sleep apnea    does not use cpap   Sleep apnea     Past Surgical History:  Procedure Laterality Date   CATARACT EXTRACTION W/PHACO Right 04/05/2023   Procedure: CATARACT EXTRACTION PHACO AND INTRAOCULAR LENS PLACEMENT (IOC) RIGHT CLAREON VIVITY  12.12  01:04.5;  Surgeon: Lockie Mola, MD;  Location: [facility];  Service: Ophthalmology;  Laterality: Right;  Diabetic   CHOLECYSTECTOMY     COLONOSCOPY     COLONOSCOPY WITH PROPOFOL N/A 12/25/2017   Procedure: COLONOSCOPY WITH PROPOFOL;  Surgeon: Scot Jun, MD;  Location: [facility] ENDOSCOPY;  Service: Endoscopy;  Laterality: N/A;   COLONOSCOPY WITH PROPOFOL N/A 08/24/2023   Procedure: COLONOSCOPY WITH PROPOFOL;  Surgeon: Jaynie Collins, DO;  Location: [facility] ENDOSCOPY;  Service: Gastroenterology;  Laterality: N/A;   ESOPHAGOGASTRODUODENOSCOPY     ESOPHAGOGASTRODUODENOSCOPY (  EGD) WITH PROPOFOL N/A 12/25/2017   Procedure: ESOPHAGOGASTRODUODENOSCOPY (EGD) WITH PROPOFOL;  Surgeon: Scot Jun, MD;  Location: [facility] ENDOSCOPY;  Service: Endoscopy;  Laterality: N/A;   ESOPHAGOGASTRODUODENOSCOPY (EGD) WITH PROPOFOL N/A 08/24/2023   Procedure: ESOPHAGOGASTRODUODENOSCOPY (EGD) WITH PROPOFOL;  Surgeon: Jaynie Collins, DO;  Location[facility] END[facility]COPY;  Service: Gastroenterology;  Laterality: N/A;   EXCISION OF CANCER FROM BACK     NOSE SURGERY  1970'S   BROKEN NOSE   POLYPECTOMY  08/24/2023   Procedure: POLYPECTOMY;  Surgeon: Jaynie Collins, DO;  Location: [facility] ENDOSCOPY;  Service: Gastroenterology;;    Social History Social History   Tobacco Use   Smoking status:  Never   Smokeless tobacco: Never  Vaping Use   Vaping status: Never Used  Substance Use Topics   Alcohol use: Yes   Drug use: Never    Family History No family history on file.  Allergies  Allergen Reactions   Alphagan [Brimonidine] Other (See Comments)    Burning eyes   Timoptic [Timolol] Other (See Comments)    Mod red burning eyes   Xalatan [Latanoprost] Other (See Comments)    Mod burning eyes     REVIEW OF SYSTEMS (Negative unless checked)  Constitutional: [] Weight loss  [] Fever  [] Chills Cardiac: [] Chest pain   [] Chest pressure   [] Palpitations   [] Shortness of breath when laying flat   [] Shortness of breath with exertion. Vascular:  [] Pain in legs with walking   [x] Pain in legs with standing  [] History of DVT   [] Phlebitis   [x] Swelling in legs   [] Varicose veins   [] Non-healing ulcers Pulmonary:   [] Uses home oxygen   [] Productive cough   [] Hemoptysis   [] Wheeze  [] COPD   [] Asthma Neurologic:  [] Dizziness   [] Seizures   [] History of stroke   [] History of TIA  [] Aphasia   [] Vissual changes   [] Weakness or numbness in arm   [] Weakness or numbness in leg Musculoskeletal:   [] Joint swelling   [] Joint pain   [] Low back pain Hematologic:  [] Easy bruising  [] Easy bleeding   [] Hypercoagulable state   [] Anemic Gastrointestinal:  [] Diarrhea   [] Vomiting  [x] Gastroesophageal reflux/heartburn   [] Difficulty swallowing. Genitourinary:  [] Chronic kidney disease   [] Difficult urination  [] Frequent urination   [] Blood in urine Skin:  [] Rashes   [] Ulcers  Psychological:  [] History of anxiety   []  History of major depression.  Physical Examination  There were no vitals filed for this visit. There is no height or weight on file to calculate BMI. Gen: WD/WN, NAD Head: [place]/AT, No temporalis wasting.  Ear/Nose/Throat: Hearing grossly intact, nares w/o erythema or drainage, pinna without lesions Eyes: PER, EOMI, sclera nonicteric.  Neck: Supple, no gross masses.  No JVD.  Pulmonary:   Good air movement, no audible wheezing, no use of accessory muscles.  Cardiac: RRR, precordium not hyperdynamic. Vascular:  scattered varicosities present bilaterally.  Moderate venous stasis changes to the legs bilaterally associated with significant inflammatory changes.  3-4+ soft pitting edema, CEAP C4sEpAsPr  Vessel Right Left  Radial Palpable Palpable  Gastrointestinal: soft, non-distended. No guarding/no peritoneal signs.  Musculoskeletal: M/S 5/5 throughout.  No deformity.  Neurologic: CN 2-12 intact. Pain and light touch intact in extremities.  Symmetrical.  Speech is fluent. Motor exam as listed above. Psychiatric: Judgment intact, Mood & affect appropriate for pt's clinical situation. Dermatologic: Venous rashes no ulcers noted.  Changes consistent with acute inflammatory venous dermatitis which could possibly represent a low-grade cellulitis. Lymph : No lichenification or  skin changes of chronic lymphedema.  CBC Lab Results  Component Value Date   WBC 7.8 08/05/2023   HGB 15.1 08/05/2023   HCT 45.4 08/05/2023   MCV 88.8 08/05/2023   PLT 288 08/05/2023    BMET    Component Value Date/Time   NA 140 08/05/2023 1041   NA 139 11/25/2012 0348   K 3.4 (L) 08/05/2023 1041   K 3.5 11/25/2012 0348   CL 101 08/05/2023 1041   CL 106 11/25/2012 0348   CO2 26 08/05/2023 1041   CO2 25 11/25/2012 0348   GLUCOSE 110 (H) 08/05/2023 1041   GLUCOSE 100 (H) 11/25/2012 0348   BUN 19 08/05/2023 1041   BUN 11 11/25/2012 0348   CREATININE 1.00 08/05/2023 1041   CREATININE 0.87 11/25/2012 0348   CALCIUM 9.4 08/05/2023 1041   CALCIUM 8.6 11/25/2012 0348   GFRNONAA >60 08/05/2023 1041   GFRNONAA >60 11/25/2012 0348   GFRAA >60 03/23/2019 0815   GFRAA >60 11/25/2012 0348   CrCl cannot be calculated (Patient's most recent lab result is older than the maximum 21 days allowed.).  COAG No results found for: "INR", "PROTIME"  Radiology CT HEAD WO CONTRAST ( ) Result Date:  09/05/2023 CLINICAL DATA:  Cellulitis of the lower extremity. Neck pain and headache. EXAM: CT HEAD WITHOUT CONTRAST TECHNIQUE: Contiguous axial images were obtained from the base of the skull through the vertex without intravenous contrast. RADIATION DOSE REDUCTION: This exam was performed according to the departmental dose-optimization program which includes automated exposure control, adjustment of the mA and/or kV according to patient size and/or use of iterative reconstruction technique. COMPARISON:  None FINDINGS: Brain: Age related volume loss. Chronic small-vessel ischemic changes of the cerebral hemispheric white matter. 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 No evidence of acute or subacute infarction, mass lesion, hemorrhage, hydrocephalus or extra-axial collection. Vascular: No abnormal vascular finding. Skull: Negative Sinuses/Orbits: Clear/normal Other: None IMPRESSION: No acute CT finding. Age related volume loss. Chronic small-vessel ischemic changes of the cerebral hemispheric white matter. 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 Electronically Signed   By: Paulina Fusi M.D.   On: 09/05/2023 16:16     Assessment/Plan 1. Lymphedema (Primary) Recommend:  I have had a long discussion with the patient regarding swelling and why it  causes symptoms.  Patient will begin wearing graduated compression on a daily basis a prescription was given. The patient will  wear the stockings first thing in the morning and removing them in the evening. The patient is instructed specifically not to sleep in the stockings.   In addition, behavioral modification will be initiated.  This will include frequent elevation, use of over the counter pain medications and exercise such as walking.  Consideration for a lymph pump will also be made based upon the effectiveness of conservative therapy.  This would  help to improve the edema control and prevent sequela such as ulcers and infections   Patient should undergo duplex ultrasound of the venous system to ensure that DVT or reflux is not present.  The patient will follow-up with me after the ultrasound.  - VAS [place] LOWER EXTREMITY VENOUS REFLUX; Future  2. Mixed hyperlipidemia Continue statin as ordered and reviewed, no changes at this time  3. Primary osteoarthritis involving multiple joints Continue medications to treat the patient's degenerative disease as already ordered, these medications have been reviewed and there are  no changes at this time.  Continued activity and therapy was stressed.  4. Type 2 diabetes mellitus without complication, unspecified whether long term insulin use (HCC) Continue hypoglycemic medications as already ordered, these medications have been reviewed and there are no changes at this time.  Hgb A1C to be monitored as already arranged by primary service  5. PAF (paroxysmal atrial fibrillation) (HCC) Continue antiarrhythmia medications as already ordered, these medications have been reviewed and there are no changes at this time.  Continue anticoagulation as ordered by Cardiology Service    Levora Dredge, MD  09/06/2023 8:15 PM

## 2023-09-07 ENCOUNTER — Encounter (INDEPENDENT_AMBULATORY_CARE_PROVIDER_SITE_OTHER): Payer: Self-pay | Admitting: Vascular Surgery

## 2023-09-07 ENCOUNTER — Ambulatory Visit (INDEPENDENT_AMBULATORY_CARE_PROVIDER_SITE_OTHER): Payer: Self-pay | Admitting: Vascular Surgery

## 2023-09-07 VITALS — BP 108/72 | HR 96 | Resp 18 | Ht 69.0 in | Wt 224.4 lb

## 2023-09-07 DIAGNOSIS — I480 Paroxysmal atrial fibrillation: Secondary | ICD-10-CM | POA: Diagnosis not present

## 2023-09-07 DIAGNOSIS — E119 Type 2 diabetes mellitus without complications: Secondary | ICD-10-CM | POA: Diagnosis not present

## 2023-09-07 DIAGNOSIS — E782 Mixed hyperlipidemia: Secondary | ICD-10-CM | POA: Diagnosis not present

## 2023-09-07 DIAGNOSIS — I890 Lymphedema, not elsewhere classified: Secondary | ICD-10-CM

## 2023-09-07 DIAGNOSIS — M150 Primary generalized (osteo)arthritis: Secondary | ICD-10-CM | POA: Diagnosis not present

## 2023-09-10 ENCOUNTER — Encounter (INDEPENDENT_AMBULATORY_CARE_PROVIDER_SITE_OTHER): Payer: Self-pay | Admitting: Vascular Surgery

## 2023-09-11 DIAGNOSIS — M542 Cervicalgia: Secondary | ICD-10-CM | POA: Diagnosis not present

## 2023-09-13 DIAGNOSIS — L03115 Cellulitis of right lower limb: Secondary | ICD-10-CM | POA: Diagnosis not present

## 2023-09-13 DIAGNOSIS — E118 Type 2 diabetes mellitus with unspecified complications: Secondary | ICD-10-CM | POA: Diagnosis not present

## 2023-09-20 DIAGNOSIS — M542 Cervicalgia: Secondary | ICD-10-CM | POA: Diagnosis not present

## 2023-10-04 DIAGNOSIS — M542 Cervicalgia: Secondary | ICD-10-CM | POA: Diagnosis not present

## 2023-10-09 ENCOUNTER — Ambulatory Visit: Admitting: Podiatry

## 2023-10-09 ENCOUNTER — Encounter: Payer: Self-pay | Admitting: Podiatry

## 2023-10-09 ENCOUNTER — Ambulatory Visit (INDEPENDENT_AMBULATORY_CARE_PROVIDER_SITE_OTHER)

## 2023-10-09 DIAGNOSIS — M10371 Gout due to renal impairment, right ankle and foot: Secondary | ICD-10-CM

## 2023-10-09 DIAGNOSIS — B353 Tinea pedis: Secondary | ICD-10-CM

## 2023-10-09 DIAGNOSIS — L84 Corns and callosities: Secondary | ICD-10-CM | POA: Diagnosis not present

## 2023-10-09 MED ORDER — KETOCONAZOLE 2 % EX CREA: 1.0000 | TOPICAL_CREAM | Freq: Two times a day (BID) | CUTANEOUS | 2 refills | Status: AC

## 2023-10-09 NOTE — Patient Instructions (Signed)
 Look for urea 40% cream or ointment and apply to the thickened dry skin / calluses. This can be bought over the counter, at a pharmacy or online such as [employer].

## 2023-10-09 NOTE — Progress Notes (Signed)
 Subjective:  Patient ID: Corey [name], male    DOB: 08-12-1940,  MRN: [id number]  Chief Complaint  Patient presents with   Nail Problem    NP - right big toe, nail has been removed, dropped something on it is inflamed. Also has callous and cracks on bottom of right foot     Discussed the use of AI scribe software for clinical note transcription with the patient, who gave verbal consent to proceed.  History of Present Illness Corey [name] is an 83 year old male with gout who presents with foot pain and ingrown toenails. He was referred by Dr. Judithann Sheen for evaluation of foot pain and ingrown toenails.  He has been experiencing foot pain since dropping a heavy object, possibly a bucket full of bolts, on his foot a few months ago. Since the incident, he has felt something 'broke loose and floating around' in the area, with recent increased discomfort. No chest pain, shortness of breath, or palpitations. He reports a tingling sensation when pressure is applied to the scar on his foot.  He describes having ingrown toenails and has been managing them by soaking his feet in Epsom salts, although he has not done so in the last couple of days. He has been applying Eucerin cream and mentions that he should be using Lotrimin for what he describes as 'Affleck's foot'.  He recalls pulling a piece of skin that was hanging, which led to bleeding, requiring a Band-Aid. His skin has been cracking, particularly on the plantar arch of his feet.  He takes a daily medication for gout, prescribed by his primary physician, Dr. Judithann Sheen, at the [place]. He has never experienced a gout attack but was advised to continue the medication to prevent recurrence.  He has an upcoming appointment with a vein specialist for an ultrasound of his legs, as he has experienced tightness and discomfort.      Objective:    Physical Exam VASCULAR: DP and PT pulse palpable. Foot is warm and well-perfused. Capillary fill time  is brisk. DERMATOLOGIC: Dry, cracking, peeling skin throughout plantar arch bilaterally. No open lesions. Normal skin turgor, texture, and temperature. NEUROLOGIC: Normal sensation to light touch and pressure. No paresthesias on examination. ORTHOPEDIC: Painful swollen interphalangeal joint and MTP joint of right foot. Limited range of motion of MTP and IPJ of right foot. No ecchymosis or bruising. No gross deformity. No pain to palpation.   No images are attached to the encounter.    Results RADIOLOGY Right foot radiographs: No acute fracture. Mild arthritic changes in the IP joint. Severe arthritic changes in the MTP joint. (10/09/2023)   Assessment:   1. Tinea pedis of both feet   2. Callus of foot   3. Acute gout due to renal impairment involving toe of right foot      Plan:  Patient was evaluated and treated and all questions answered.  Assessment and Plan Assessment & Plan Right foot pain with suspected arthritis He reports pain and a sensation of something floating in the right foot following trauma from a heavy object. Radiographs show mild arthritic changes in the interphalangeal joint and severe changes in the metatarsophalangeal joint, with pain localized to these areas and limited range of motion. Differential diagnosis includes arthritis, possibly exacerbated by trauma. X-ray confirms no acute fracture.  Suspect gout flare is also possibility.  Lab work ordered including ESR CBC and uric acid and metabolic panel to evaluate.  I will let him know what the  results of the show and prescribe him colchicine or prednisone as needed based on the results   Tinea pedis (Athlete's foot) He presents with dry, cracking, and peeling skin on the plantar arch bilaterally, consistent with tinea pedis. Previous use of Eucerin cream and Lotrimin was ineffective. Prescription-strength antifungal cream, ketoconazole, is recommended for better efficacy. - Prescribe ketoconazole cream for  twice daily application to affected areas.         Return if symptoms worsen or fail to improve.

## 2023-10-10 LAB — CBC WITH DIFFERENTIAL/PLATELET
Basophils Absolute: 0 10*3/uL (ref 0.0–0.2)
Basos: 0 %
EOS (ABSOLUTE): 0.1 10*3/uL (ref 0.0–0.4)
Eos: 2 %
Hematocrit: 46.5 % (ref 37.5–51.0)
Hemoglobin: 15.3 g/dL (ref 13.0–17.7)
Immature Grans (Abs): 0 10*3/uL (ref 0.0–0.1)
Immature Granulocytes: 0 %
Lymphocytes Absolute: 1.2 10*3/uL (ref 0.7–3.1)
Lymphs: 14 %
MCH: 29.9 pg (ref 26.6–33.0)
MCHC: 32.9 g/dL (ref 31.5–35.7)
MCV: 91 fL (ref 79–97)
Monocytes Absolute: 0.8 10*3/uL (ref 0.1–0.9)
Monocytes: 9 %
Neutrophils Absolute: 6.7 10*3/uL (ref 1.4–7.0)
Neutrophils: 75 %
Platelets: 286 10*3/uL (ref 150–450)
RBC: 5.11 x10E6/uL (ref 4.14–5.80)
RDW: 13.2 % (ref 11.6–15.4)
WBC: 9 10*3/uL (ref 3.4–10.8)

## 2023-10-10 LAB — BASIC METABOLIC PANEL WITH GFR
BUN/Creatinine Ratio: 25 — ABNORMAL HIGH (ref 10–24)
BUN: 22 mg/dL (ref 8–27)
CO2: 24 mmol/L (ref 20–29)
Calcium: 9.8 mg/dL (ref 8.6–10.2)
Chloride: 102 mmol/L (ref 96–106)
Creatinine, Ser: 0.89 mg/dL (ref 0.76–1.27)
Glucose: 106 mg/dL — ABNORMAL HIGH (ref 70–99)
Potassium: 3.7 mmol/L (ref 3.5–5.2)
Sodium: 140 mmol/L (ref 134–144)
eGFR: 86 mL/min/{1.73_m2} (ref >59)

## 2023-10-10 LAB — SEDIMENTATION RATE: Sed Rate: 22 mm/h (ref 0–30)

## 2023-10-10 LAB — URIC ACID: Uric Acid: 6.9 mg/dL (ref 3.8–8.4)

## 2023-10-10 MED ORDER — COLCHICINE 0.6 MG PO TABS: ORAL_TABLET | ORAL | 2 refills | Status: AC

## 2023-10-10 NOTE — Addendum Note (Signed)
 Addended byLilian Kapur, Fani Rotondo R on: 10/10/2023 05:00 PM   Modules accepted: Orders

## 2023-10-12 DIAGNOSIS — E118 Type 2 diabetes mellitus with unspecified complications: Secondary | ICD-10-CM | POA: Diagnosis not present

## 2023-10-12 DIAGNOSIS — Z79899 Other long term (current) drug therapy: Secondary | ICD-10-CM | POA: Diagnosis not present

## 2023-10-31 DIAGNOSIS — Z961 Presence of intraocular lens: Secondary | ICD-10-CM | POA: Diagnosis not present

## 2023-10-31 DIAGNOSIS — H33311 Horseshoe tear of retina without detachment, right eye: Secondary | ICD-10-CM | POA: Diagnosis not present

## 2023-10-31 DIAGNOSIS — H40003 Preglaucoma, unspecified, bilateral: Secondary | ICD-10-CM | POA: Diagnosis not present

## 2023-11-13 DIAGNOSIS — E782 Mixed hyperlipidemia: Secondary | ICD-10-CM | POA: Diagnosis not present

## 2023-11-13 DIAGNOSIS — I480 Paroxysmal atrial fibrillation: Secondary | ICD-10-CM | POA: Diagnosis not present

## 2023-11-13 DIAGNOSIS — K219 Gastro-esophageal reflux disease without esophagitis: Secondary | ICD-10-CM | POA: Diagnosis not present

## 2023-11-13 DIAGNOSIS — E669 Obesity, unspecified: Secondary | ICD-10-CM | POA: Diagnosis not present

## 2023-11-13 DIAGNOSIS — E119 Type 2 diabetes mellitus without complications: Secondary | ICD-10-CM | POA: Diagnosis not present

## 2023-11-13 DIAGNOSIS — G4730 Sleep apnea, unspecified: Secondary | ICD-10-CM | POA: Diagnosis not present

## 2023-11-13 DIAGNOSIS — I10 Essential (primary) hypertension: Secondary | ICD-10-CM | POA: Diagnosis not present

## 2023-11-13 DIAGNOSIS — R0602 Shortness of breath: Secondary | ICD-10-CM | POA: Diagnosis not present

## 2023-11-14 DIAGNOSIS — B353 Tinea pedis: Secondary | ICD-10-CM | POA: Diagnosis not present

## 2023-11-14 DIAGNOSIS — L03115 Cellulitis of right lower limb: Secondary | ICD-10-CM | POA: Diagnosis not present

## 2023-11-14 DIAGNOSIS — E118 Type 2 diabetes mellitus with unspecified complications: Secondary | ICD-10-CM | POA: Diagnosis not present

## 2023-11-14 DIAGNOSIS — M7989 Other specified soft tissue disorders: Secondary | ICD-10-CM | POA: Diagnosis not present

## 2023-12-07 ENCOUNTER — Encounter (INDEPENDENT_AMBULATORY_CARE_PROVIDER_SITE_OTHER): Payer: PPO

## 2023-12-07 ENCOUNTER — Ambulatory Visit (INDEPENDENT_AMBULATORY_CARE_PROVIDER_SITE_OTHER): Payer: PPO | Admitting: Vascular Surgery

## 2023-12-14 ENCOUNTER — Other Ambulatory Visit (INDEPENDENT_AMBULATORY_CARE_PROVIDER_SITE_OTHER): Payer: Self-pay | Admitting: Vascular Surgery

## 2023-12-14 DIAGNOSIS — M79606 Pain in leg, unspecified: Secondary | ICD-10-CM

## 2023-12-14 DIAGNOSIS — I890 Lymphedema, not elsewhere classified: Secondary | ICD-10-CM

## 2023-12-18 ENCOUNTER — Encounter (INDEPENDENT_AMBULATORY_CARE_PROVIDER_SITE_OTHER): Payer: Self-pay | Admitting: Vascular Surgery

## 2023-12-18 ENCOUNTER — Ambulatory Visit (INDEPENDENT_AMBULATORY_CARE_PROVIDER_SITE_OTHER): Payer: PPO

## 2023-12-18 ENCOUNTER — Ambulatory Visit (INDEPENDENT_AMBULATORY_CARE_PROVIDER_SITE_OTHER): Payer: PPO | Admitting: Vascular Surgery

## 2023-12-18 VITALS — BP 100/63 | HR 90 | Ht 69.0 in | Wt 204.4 lb

## 2023-12-18 DIAGNOSIS — E782 Mixed hyperlipidemia: Secondary | ICD-10-CM

## 2023-12-18 DIAGNOSIS — M7989 Other specified soft tissue disorders: Secondary | ICD-10-CM | POA: Diagnosis not present

## 2023-12-18 DIAGNOSIS — M79606 Pain in leg, unspecified: Secondary | ICD-10-CM

## 2023-12-18 DIAGNOSIS — I872 Venous insufficiency (chronic) (peripheral): Secondary | ICD-10-CM | POA: Diagnosis not present

## 2023-12-18 DIAGNOSIS — E119 Type 2 diabetes mellitus without complications: Secondary | ICD-10-CM | POA: Diagnosis not present

## 2023-12-18 DIAGNOSIS — I890 Lymphedema, not elsewhere classified: Secondary | ICD-10-CM

## 2023-12-18 DIAGNOSIS — K219 Gastro-esophageal reflux disease without esophagitis: Secondary | ICD-10-CM

## 2023-12-18 DIAGNOSIS — I480 Paroxysmal atrial fibrillation: Secondary | ICD-10-CM

## 2023-12-20 ENCOUNTER — Encounter (INDEPENDENT_AMBULATORY_CARE_PROVIDER_SITE_OTHER): Payer: Self-pay | Admitting: Vascular Surgery

## 2023-12-20 DIAGNOSIS — I872 Venous insufficiency (chronic) (peripheral): Secondary | ICD-10-CM | POA: Insufficient documentation

## 2023-12-20 NOTE — Progress Notes (Signed)
 MRN : [id number]  Corey [name] is a 83 y.o. (03-May-1941) male who presents with chief complaint of legs hurt and swell.  History of Present Illness:   The patient returns to the office for followup evaluation regarding leg swelling.  The swelling has improved quite a bit and the pain associated with swelling has decreased substantially. There have not been any interval development of a ulcerations or wounds.  Since the previous visit the patient has been wearing graduated compression stockings and has noted some improvement in the lymphedema. The patient has been using compression routinely morning until night.  The patient also states elevation during the day and exercise (such as walking) is being done too.  Duplex ultrasound of the bilateral lower extremity venous system dated December 18 2023 demonstrates normal deep venous system bilaterally.  There is trivial reflux in the right common femoral vein.  There is a short segment of reflux noted in the great saphenous vein  proximally.  Current Meds  Medication Sig   allopurinol  (ZYLOPRIM ) 100 MG tablet Take 100 mg by mouth daily at 3 pm.    calcium carbonate (OS-CAL - DOSED IN MG OF ELEMENTAL CALCIUM) 1250 (500 Ca) MG tablet Take 1 tablet by mouth. 1000mg  daily   cephALEXin  (KEFLEX ) 500 MG capsule Take 1 capsule (500 mg total) by mouth 3 (three) times daily.   cholecalciferol (VITAMIN D3) 10 MCG (400 UNIT) TABS tablet Take 400 Units by mouth daily.   colchicine  0.6 MG tablet Take 1.2mg  (2 tablets) then 0.6mg  (1 tablet) 1 hour after. Then, take 1 tablet every day for 7 days.   Elastic Bandages & Supports (MEDICAL COMPRESSION STOCKINGS) MISC Please provide compression stockings   glimepiride  (AMARYL ) 2 MG tablet Take 4 mg by mouth daily with breakfast.   hydrochlorothiazide  (HYDRODIURIL ) 25 MG tablet Take 25 mg by mouth daily at 3 pm.    Ipratropium-Albuterol (COMBIVENT RESPIMAT) 20-100 MCG/ACT AERS respimat Inhale 1 puff into  the lungs every 6 (six) hours.   ketoconazole  (NIZORAL ) 2 % cream Apply 1 Application topically 2 (two) times daily.   loratadine  (CLARITIN ) 10 MG tablet Take 10 mg by mouth daily.   loratadine -pseudoephedrine (CLARITIN -D 24-HOUR) 10-240 MG 24 hr tablet Take 1 tablet by mouth daily.   magnesium oxide (MAG-OX) 400 (240 Mg) MG tablet Take 400 mg by mouth daily.   omeprazole (PRILOSEC) 20 MG capsule Take 20 mg by mouth daily before breakfast.    potassium bicarbonate (K-LYTE) 25 MEQ disintegrating tablet Take 25 mEq by mouth 2 (two) times daily.   rivaroxaban (XARELTO) 20 MG TABS tablet Take 20 mg by mouth daily with breakfast.    rosuvastatin (CRESTOR) 10 MG tablet Take 10 mg by mouth Nightly.   sildenafil  (REVATIO ) 20 MG tablet Take 20 mg by mouth 3 (three) times daily. 2-5 tablets as needed 1 hour prior to intercourse   tamsulosin  (FLOMAX ) 0.4 MG CAPS capsule Take 2 capsules (0.8 mg total) by mouth daily after supper.   zinc gluconate 50 MG tablet Take 50 mg by mouth daily.    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   Diabetes mellitus without complication (HCC)    Dysphagia    Dysrhythmia    A fib  ED (erectile dysfunction)    Environmental allergies    Environmental allergies    GERD (gastroesophageal reflux disease)    Glaucoma (increased eye pressure)    History of chicken pox    History of colonic polyps    History of gastric ulcer    History of hiatal hernia    History of kidney stones    h/o   Hyperlipidemia    Hypertension    Lower back pain    GOES INTO RIGHT HIP   Multiple gastric ulcers    Nephrolithiasis    Obesity    Osteoarthritis    PAF (paroxysmal atrial fibrillation) (HCC)    Scrotal varices    Sleep apnea    does not use cpap   Sleep apnea     Past Surgical History:   Procedure Laterality Date   CATARACT EXTRACTION W/PHACO Right 04/05/2023   Procedure: CATARACT EXTRACTION PHACO AND INTRAOCULAR LENS PLACEMENT (IOC) RIGHT [facility] [facility]ITY  12.12  01:04.5;  Surgeon: Annell Kidney, MD;  Location: [facility];  Service: Ophthalmology;  Laterality: Right;  Diabetic   CHOLECYSTECTOMY     COLONOSCOPY     COLONOSCOPY WITH PROPOFOL  N/A 12/25/2017   Procedure: COLONOSCOPY WITH PROPOFOL ;  Surgeon: Cassie Click, MD;  Location: [facility];  Service: Endoscopy;  Laterality: N/A;   COLONOSCOPY WITH PROPOFOL  N/A 08/24/2023   Procedure: COLONOSCOPY WITH PROPOFOL ;  Surgeon: Quintin Buckle, DO;  Location: [facility] ENDOSCOPY;  Service: Gastroenterology;  Laterality: N/A;   ESOPHAGOGASTRODUODENOSCOPY     ESOPHAGOGASTRODUODENOSCOPY (EGD) WITH PROPOFOL  N/A 12/25/2017   Procedure: ESOPHAGOGASTRODUODENOSCOPY (EGD) WITH PROPOFOL ;  Surgeon: Cassie Click, MD;  Location: [facility] END[facility];  Service: Endoscopy;  Laterality: N/A;   ESOPHAGOGASTRODUODENOSCOPY (EGD) WITH PROPOFOL  N/A 08/24/2023   Procedure: ESOPHAGOGASTRODUODENOSCOPY (EGD) WITH PROPOFOL ;  Surgeon: Quintin Buckle, DO;  Location: [facility] END[facility];  Service: Gastroenterology;  Laterality: N/A;   EXCISION OF CANCER FROM BACK     NOSE SURGERY  1970'S   BROKEN NOSE   POLYPECTOMY  08/24/2023   Procedure: POLYPECTOMY;  Surgeon: Quintin Buckle, DO;  Location: [facility] ENDOSCOPY;  Service: Gastroenterology;;    Social History Social History   Tobacco Use   Smoking status: Never   Smokeless tobacco: Never  Vaping Use   Vaping status: Never Used  Substance Use Topics   Alcohol use: Yes   Drug use: Never    Family History History reviewed. No pertinent family history.  Allergies  Allergen Reactions   Alphagan [Brimonidine] Other (See Comments)    Burning eyes   Timoptic [Timolol] Other (See Comments)    Mod red burning eyes   Xalatan [Latanoprost] Other (See Comments)    Mod burning  eyes     REVIEW OF SYSTEMS (Negative unless checked)  Constitutional: [] Weight loss  [] Fever  [] Chills Cardiac: [] Chest pain   [] Chest pressure   [] Palpitations   [] Shortness of breath when laying flat   [] Shortness of breath with exertion. Vascular:  [] Pain in legs with walking   [x] Pain in legs at rest  [] History of DVT   [] Phlebitis   [x] Swelling in legs   [] Varicose veins   [] Non-healing ulcers Pulmonary:   [] Uses home oxygen   [] Productive cough   [] Hemoptysis   [] Wheeze  [] COPD   [] Asthma Neurologic:  [] Dizziness   [] Seizures   [] History of stroke   [] History of TIA  [] Aphasia   [] Vissual changes   [] Weakness or numbness in arm   [] Weakness or numbness in leg Musculoskeletal:   []   Joint swelling   [] Joint pain   [] Low back pain Hematologic:  [] Easy bruising  [] Easy bleeding   [] Hypercoagulable state   [] Anemic Gastrointestinal:  [] Diarrhea   [] Vomiting  [] Gastroesophageal reflux/heartburn   [] Difficulty swallowing. Genitourinary:  [] Chronic kidney disease   [] Difficult urination  [] Frequent urination   [] Blood in urine Skin:  [] Rashes   [] Ulcers  Psychological:  [] History of anxiety   []  History of major depression.  Physical Examination  Vitals:   12/18/23 1049  BP: 100/63  Pulse: 90  Weight: 204 lb 6 oz (92.7 kg)  Height: 5' 9 (1.753 m)   Body mass index is 30.18 kg/m. Gen: WD/WN, NAD Head: [place]/AT, No temporalis wasting.  Ear/Nose/Throat: Hearing grossly intact, nares w/o erythema or drainage, pinna without lesions Eyes: PER, EOMI, sclera nonicteric.  Neck: Supple, no gross masses.  No JVD.  Pulmonary:  Good air movement, no audible wheezing, no use of accessory muscles.  Cardiac: RRR, precordium not hyperdynamic. Vascular:  scattered varicosities present bilaterally.  Moderate venous stasis changes to the legs bilaterally.  2+ soft pitting edema. CEAP [id number]   Vessel Right Left  Radial Palpable Palpable  Gastrointestinal: soft, non-distended. No guarding/no  peritoneal signs.  Musculoskeletal: M/S 5/5 throughout.  No deformity.  Neurologic: CN 2-12 intact. Pain and light touch intact in extremities.  Symmetrical.  Speech is fluent. Motor exam as listed above. Psychiatric: Judgment intact, Mood & affect appropriate for pt's clinical situation. Dermatologic: Venous rashes no ulcers noted.  No changes consistent with cellulitis. Lymph : No lichenification or skin changes of chronic lymphedema.  CBC Lab Results  Component Value Date   WBC 9.0 10/09/2023   HGB 15.3 10/09/2023   HCT 46.5 10/09/2023   MCV 91 10/09/2023   PLT 286 10/09/2023    BMET    Component Value Date/Time   NA 140 10/09/2023 0917   NA 139 11/25/2012 0348   K 3.7 10/09/2023 0917   K 3.5 11/25/2012 0348   CL 102 10/09/2023 0917   CL 106 11/25/2012 0348   CO2 24 10/09/2023 0917   CO2 25 11/25/2012 0348   GLUCOSE 106 (H) 10/09/2023 0917   GLUCOSE 110 (H) 08/05/2023 1041   GLUCOSE 100 (H) 11/25/2012 0348   BUN 22 10/09/2023 0917   BUN 11 11/25/2012 0348   CREATININE 0.89 10/09/2023 0917   CREATININE 0.87 11/25/2012 0348   CALCIUM 9.8 10/09/2023 0917   CALCIUM 8.6 11/25/2012 0348   GFRNONAA >60 08/05/2023 1041   GFRNONAA >60 11/25/2012 0348   GFRAA >60 03/23/2019 0815   GFRAA >60 11/25/2012 0348   CrCl cannot be calculated (Patient's most recent lab result is older than the maximum 21 days allowed.).  COAG No results found for: INR, PROTIME  Radiology VAS US  LOWER EXTREMITY VENOUS REFLUX Result Date: 12/18/2023  Lower Venous Reflux Study Patient Name:  SHAFIQ LARCH  Date of Exam:   12/18/2023 Medical Rec #: [id number]     Accession #:    [id number] Date of Birth: 1940/09/10     Patient Gender: M Patient Age:   24 years Exam Location:  [facility] Procedure:      VAS US  LOWER EXTREMITY VENOUS REFLUX Referring Phys: Devon Fogo --------------------------------------------------------------------------------  Indications: Swelling, Pain, and  cellulitis.  Performing Technologist: Oneta Bilberry RVT  Examination Guidelines: A complete evaluation includes B-mode imaging, spectral Doppler, color Doppler, and power Doppler as needed of all accessible portions of each vessel. Bilateral testing is considered an integral part of a  complete examination. Limited examinations for reoccurring indications may be performed as noted. The reflux portion of the exam is performed with the patient in reverse Trendelenburg. Significant venous reflux is defined as >500 ms in the superficial venous system, and >1 second in the deep venous system.  Venous Reflux Times +--------------+---------+------+-----------+------------+--------+ RIGHT         Reflux NoRefluxReflux TimeDiameter cmsComments                         Yes                                  +--------------+---------+------+-----------+------------+--------+ CFV                     yes   >1 second                      +--------------+---------+------+-----------+------------+--------+ FV prox       no                                             +--------------+---------+------+-----------+------------+--------+ FV mid        no                                             +--------------+---------+------+-----------+------------+--------+ FV dist       no                                             +--------------+---------+------+-----------+------------+--------+ Popliteal     no                                             +--------------+---------+------+-----------+------------+--------+ GSV at [facility]    no                            0.55             +--------------+---------+------+-----------+------------+--------+ GSV prox thighno                            0.47             +--------------+---------+------+-----------+------------+--------+ GSV mid thigh no                            0.42              +--------------+---------+------+-----------+------------+--------+ GSV dist thighno                            0.32             +--------------+---------+------+-----------+------------+--------+ GSV at knee   no  0.38             +--------------+---------+------+-----------+------------+--------+ GSV prox calf no                            0.35             +--------------+---------+------+-----------+------------+--------+ SSV Pop Fossa no                            0.26             +--------------+---------+------+-----------+------------+--------+ SSV prox calf no                            0.32             +--------------+---------+------+-----------+------------+--------+ SSV mid calf  no                            0.26             +--------------+---------+------+-----------+------------+--------+  +--------------+---------+------+-----------+------------+--------+ LEFT          Reflux NoRefluxReflux TimeDiameter cmsComments                         Yes                                  +--------------+---------+------+-----------+------------+--------+ CFV           no                                             +--------------+---------+------+-----------+------------+--------+ FV prox       no                                             +--------------+---------+------+-----------+------------+--------+ FV mid        no                                             +--------------+---------+------+-----------+------------+--------+ FV dist       no                                             +--------------+---------+------+-----------+------------+--------+ Popliteal     no                                             +--------------+---------+------+-----------+------------+--------+ GSV at [facility]    no                            0.58              +--------------+---------+------+-----------+------------+--------+ GSV prox thigh  yes    >500 ms      0.51             +--------------+---------+------+-----------+------------+--------+ GSV mid thigh no                            0.39             +--------------+---------+------+-----------+------------+--------+ GSV dist thighno                            0.41             +--------------+---------+------+-----------+------------+--------+ GSV at knee   no                            0.36             +--------------+---------+------+-----------+------------+--------+ GSV prox calf no                            0.24             +--------------+---------+------+-----------+------------+--------+ SSV Pop Fossa no                            0.53             +--------------+---------+------+-----------+------------+--------+ SSV prox calf no                            0.49             +--------------+---------+------+-----------+------------+--------+ SSV mid calf  no                            0.39             +--------------+---------+------+-----------+------------+--------+   Summary: Right: - No evidence of deep vein thrombosis seen in the right lower extremity, from the common femoral through the popliteal veins. - There is no evidence of venous reflux seen in the right lower extremity. - No evidence of superficial venous reflux seen in the right greater saphenous vein. - No evidence of superficial venous reflux seen in the right short saphenous vein.  Left: - No evidence of deep vein thrombosis seen in the left lower extremity, from the common femoral through the popliteal veins. - No evidence of superficial venous thrombosis in the left lower extremity. - No evidence of superficial venous reflux seen in the left short saphenous vein. - Venous reflux is noted in the left greater saphenous vein in the thigh.  *See table(s) above for measurements and  observations. Electronically signed by Devon Fogo MD on 12/18/2023 at 5:24:15 PM.    Final      Assessment/Plan 1. Chronic venous insufficiency (Primary) Recommend:  No surgery or intervention at this point in time.  I have reviewed my discussion with the patient regarding venous insufficiency and why it causes symptoms. I have discussed with the patient the chronic skin changes that accompany venous insufficiency and the long term sequela such as ulceration. Patient will contnue wearing graduated compression stockings on a daily basis, as this has provided excellent control of his edema. The patient will put the stockings on first thing in the morning and removing them in the evening. The patient is reminded not  to sleep in the stockings.  In addition, behavioral modification including elevation during the day will be initiated. Exercise is strongly encouraged.  Previous duplex ultrasound of the lower extremities shows normal deep system, no significant superficial reflux was identified.  Given the patient's good control and lack of any problems regarding the venous insufficiency and lymphedema a lymph pump in not need at this time.    The patient will follow up with me PRN should anything change.  The patient voices agreement with this plan.  2. PAF (paroxysmal atrial fibrillation) (HCC) Continue antiarrhythmia medications as already ordered, these medications have been reviewed and there are no changes at this time.  Continue anticoagulation as ordered by Cardiology Service  3. Gastro-esophageal reflux disease without esophagitis Continue PPI as already ordered, this medication has been reviewed and there are no changes at this time.  Avoidence of caffeine and alcohol  Moderate elevation of the head of the bed   4. Type 2 diabetes mellitus without complication, unspecified whether long term insulin use (HCC) Continue hypoglycemic medications as already ordered, these  medications have been reviewed and there are no changes at this time.  Hgb A1C to be monitored as already arranged by primary service  5. Mixed hyperlipidemia Continue statin as ordered and reviewed, no changes at this time    Devon Fogo, MD  12/20/2023 8:33 AM

## 2024-01-08 DIAGNOSIS — Z79899 Other long term (current) drug therapy: Secondary | ICD-10-CM | POA: Diagnosis not present

## 2024-01-08 DIAGNOSIS — K219 Gastro-esophageal reflux disease without esophagitis: Secondary | ICD-10-CM | POA: Diagnosis not present

## 2024-01-08 DIAGNOSIS — E782 Mixed hyperlipidemia: Secondary | ICD-10-CM | POA: Diagnosis not present

## 2024-01-08 DIAGNOSIS — N401 Enlarged prostate with lower urinary tract symptoms: Secondary | ICD-10-CM | POA: Diagnosis not present

## 2024-01-08 DIAGNOSIS — I480 Paroxysmal atrial fibrillation: Secondary | ICD-10-CM | POA: Diagnosis not present

## 2024-01-08 DIAGNOSIS — E66811 Obesity, class 1: Secondary | ICD-10-CM | POA: Diagnosis not present

## 2024-01-08 DIAGNOSIS — Z125 Encounter for screening for malignant neoplasm of prostate: Secondary | ICD-10-CM | POA: Diagnosis not present

## 2024-01-08 DIAGNOSIS — E118 Type 2 diabetes mellitus with unspecified complications: Secondary | ICD-10-CM | POA: Diagnosis not present

## 2024-01-08 DIAGNOSIS — I10 Essential (primary) hypertension: Secondary | ICD-10-CM | POA: Diagnosis not present

## 2024-01-31 DIAGNOSIS — L03115 Cellulitis of right lower limb: Secondary | ICD-10-CM | POA: Diagnosis not present

## 2024-02-07 DIAGNOSIS — E118 Type 2 diabetes mellitus with unspecified complications: Secondary | ICD-10-CM | POA: Diagnosis not present

## 2024-02-07 DIAGNOSIS — Z125 Encounter for screening for malignant neoplasm of prostate: Secondary | ICD-10-CM | POA: Diagnosis not present

## 2024-02-07 DIAGNOSIS — Z79899 Other long term (current) drug therapy: Secondary | ICD-10-CM | POA: Diagnosis not present

## 2024-02-07 DIAGNOSIS — I10 Essential (primary) hypertension: Secondary | ICD-10-CM | POA: Diagnosis not present

## 2024-02-07 DIAGNOSIS — E782 Mixed hyperlipidemia: Secondary | ICD-10-CM | POA: Diagnosis not present

## 2024-04-26 DIAGNOSIS — Z961 Presence of intraocular lens: Secondary | ICD-10-CM | POA: Diagnosis not present

## 2024-04-26 DIAGNOSIS — H40003 Preglaucoma, unspecified, bilateral: Secondary | ICD-10-CM | POA: Diagnosis not present

## 2024-04-26 DIAGNOSIS — H2512 Age-related nuclear cataract, left eye: Secondary | ICD-10-CM | POA: Diagnosis not present

## 2024-05-08 DIAGNOSIS — Z85828 Personal history of other malignant neoplasm of skin: Secondary | ICD-10-CM | POA: Diagnosis not present

## 2024-05-08 DIAGNOSIS — L821 Other seborrheic keratosis: Secondary | ICD-10-CM | POA: Diagnosis not present

## 2024-05-08 DIAGNOSIS — Z08 Encounter for follow-up examination after completed treatment for malignant neoplasm: Secondary | ICD-10-CM | POA: Diagnosis not present

## 2024-05-08 DIAGNOSIS — I872 Venous insufficiency (chronic) (peripheral): Secondary | ICD-10-CM | POA: Diagnosis not present

## 2024-05-13 DIAGNOSIS — G4730 Sleep apnea, unspecified: Secondary | ICD-10-CM | POA: Diagnosis not present

## 2024-05-13 DIAGNOSIS — R1310 Dysphagia, unspecified: Secondary | ICD-10-CM | POA: Diagnosis not present

## 2024-05-13 DIAGNOSIS — I10 Essential (primary) hypertension: Secondary | ICD-10-CM | POA: Diagnosis not present

## 2024-05-13 DIAGNOSIS — E669 Obesity, unspecified: Secondary | ICD-10-CM | POA: Diagnosis not present

## 2024-05-13 DIAGNOSIS — E119 Type 2 diabetes mellitus without complications: Secondary | ICD-10-CM | POA: Diagnosis not present

## 2024-05-13 DIAGNOSIS — E782 Mixed hyperlipidemia: Secondary | ICD-10-CM | POA: Diagnosis not present

## 2024-05-13 DIAGNOSIS — K219 Gastro-esophageal reflux disease without esophagitis: Secondary | ICD-10-CM | POA: Diagnosis not present

## 2024-05-13 DIAGNOSIS — I480 Paroxysmal atrial fibrillation: Secondary | ICD-10-CM | POA: Diagnosis not present

## 2024-05-13 DIAGNOSIS — R0602 Shortness of breath: Secondary | ICD-10-CM | POA: Diagnosis not present

## 2024-07-10 NOTE — Progress Notes (Signed)
 Corey [name] is a 83 y.o. here for Medicare Wellness Visit  MEDICARE WELLNESS VISIT  Providers Rendering Care 1. Dr. Reyes Costa (PCP) Pt in NAD. Here for yearly eval. HTN stable on meds. Has PAF on Eliquis, HLD on statin, DM not on insulin and GERD on PPI. Also with gout and BPH on meds. Weight stable. Now with umbilical hernia. No pain. No change in bowels or bladder. Sleeping well. No fever. Denies CP or SOB. No palpitations.  Functional Assessment (1) Hearing: Demonstrates no difficulty in hearing during normal conversation (2) Risk of Falls: Patient denies any falls or near falls in the last year, Gait steady with assistance from cane during walk from waiting area to exam room (3) Home Safety: Patient feels secure in their home, There are operational smoke alarms in multiple areas of the home (4) Activities of Daily Living: Independently manages personal grooming and household chores, including cooking, cleaning and laundry. Manages Personal finances without assistance.  Depression Screening PHQ 2/9 last 3 flowsheet values     09/08/2021   10:09 AM 07/13/2023    1:22 PM 07/10/2024    9:22 AM  PHQ-2/9 Depression Screening   Little interest or pleasure in doing things  0 0  Feeling down, depressed, or hopeless  0 0  Patient Health Questionnaire-2 Score  0 * 0  (OBSOLETE) Little interest or pleasure in doing things 0    (OBSOLETE) Feeling down, depressed, or hopeless (or irritable for Teens only)? 0    (OBSOLETE) Total Prescreening Score 0    (OBSOLETE) Total Score = 0      * Data saved with a previous flowsheet row definition     Depression Severity and Treatment Recommendations:  0-4= None  5-9= Mild / Treatment: Support, educate to call if worse; return in one month  10-14= Moderate / Treatment: Support, watchful waiting; Antidepressant or Psychotherapy  15-19= Moderately severe / Treatment: Antidepressant OR Psychotherapy  >= 20 = Major depression, severe /  Antidepressant AND Psychotherapy   Cognitive Impairment Patient denies episodes of loosing things, being forgetful. Seems oriented to person, place and time.  Responses appear appropriate and timely to this observer.  PREVENTION PLAN  Cardiovascular: FLP assessed LDL=43 Diabetes: A1c or FBG assessed A1c=5.9 Glaucoma: N/A Hepatitis B (HBV) Vaccine:  Not Applicable Smoking Cessation:  Not Applicable  Other Personalized Health Advice  Encouraged patient to exercise 5 days a week, walking, water  aerobics, gentle stretching recommended. Increase dietary intake of fresh fruits and vegetables, reduce red meat to twice a week.  End of Life Counseling Patient has living will in place; POA - ; Full Code  Current Outpatient Medications  Medication Sig Dispense Refill   acetaminophen  (TYLENOL ) 650 MG ER tablet Take 650 mg by mouth every 4 (four) hours as needed for Pain.     allopurinoL  (ZYLOPRIM ) 100 MG tablet Take 1 tablet (100 mg total) by mouth once daily 90 tablet 3   cholecalciferol (VITAMIN D3) 2,000 unit capsule Take 2,000 Units by mouth once daily     glimepiride  (AMARYL ) 2 MG tablet Take 2 tablets (4 mg total) by mouth daily with breakfast 180 tablet 1   hydroCHLOROthiazide  (HYDRODIURIL ) 25 MG tablet Take 1 tablet (25 mg total) by mouth once daily 90 tablet 3   loratadine -pseudoephedrine (CLARITIN -D 24-HOUR) 10-240 mg ER tablet Take 1 tablet by mouth every morning     methocarbamoL (ROBAXIN) 750 MG tablet Take 1 tablet (750 mg total) by mouth at bedtime 30 tablet 5  omeprazole (PRILOSEC) 20 MG DR capsule TAKE 1 CAPSULE BY MOUTH EVERY DAY 90 capsule 2   rivaroxaban (XARELTO) 20 mg tablet TAKE 1 TABLET BY MOUTH ONCE DAILY WITH BREAKFAST 90 tablet 3   rosuvastatin (CRESTOR) 10 MG tablet Take 1 tablet (10 mg total) by mouth once daily 90 tablet 3   sennosides-docusate (SENOKOT-S) 8.6-50 mg tablet Take 1 tablet by mouth once daily     sildenafil  (REVATIO ) 20 mg tablet 2-5  tablets as needed 1 hour prior to intercourse     tamsulosin  (FLOMAX ) 0.4 mg capsule TAKE 1 CAPSULE BY MOUTH EVERY DAY 30 MINUTES AFTER THE SAME MEAL 90 capsule 1   ZINC ORAL Take by mouth     No current facility-administered medications for this visit.    Allergies as of 07/10/2024   (No Known Allergies)    Patient Active Problem List  Diagnosis   Type II diabetes mellitus with complication (CMS/HHS-HCC)   Hyperlipidemia   Osteoarthritis   Obesity   Glaucoma (increased eye pressure)   History of colonic polyps   History of gastric ulcer   GERD (gastroesophageal reflux disease)   Environmental allergies   Nephrolithiasis   Erectile dysfunction   Sleep apnea   Chickenpox   PAF (paroxysmal atrial fibrillation) (CMS/HHS-HCC)   Dysphagia   Benign prostatic hyperplasia with nocturia   Chronic cholecystitis   Increased frequency of urination   Scrotal varices   Squamous cell carcinoma of skin of face   Enlarged prostate with lower urinary tract symptoms (LUTS)   HTN, goal below 140/80    Past Medical History:  Diagnosis Date   BPH (benign prostatic hypertrophy)    Chickenpox    Diabetes mellitus type 2, uncomplicated (CMS/HHS-HCC)    Dysphagia 10/09/2017   Environmental allergies    Erectile dysfunction    GERD (gastroesophageal reflux disease)     a.  Stricture   Glaucoma (increased eye pressure)    History of colonic polyps    History of gastric ulcer    a.  S/p GI bleed   Hyperlipidemia    Nephrolithiasis    Obesity    Osteoarthritis    a.  Knee  b.  Lumbar spine with mild spondylolisthesis  c.  Hemi-sacrolized vertebra L5.   d.  Shoulder.   Sleep apnea    intolerant of CPAP    Past Surgical History:  Procedure Laterality Date   COLONOSCOPY  01/19/1990   30cm Adenomatous Polyp   COLONOSCOPY  09/19/2007   Adenomatous Polyp   COLONOSCOPY  11/19/2012   PH Adenomatous Polyps: [facility] 11/2017; Recall Ltr mailed 10/04/2017  (dh)   COLONOSCOPY  12/25/2017   Adenomatous Polyp: [facility] 12/2022   robotic lap chole  03/2019   by isami sakai   CHOLECYSTECTOMY OPEN N/A 04/2019   Colon @ [facility]  08/24/2023   Tubular adenoma/Sessile serrated polyp/PHx CP/No repeat due to age/SMR   EGD @ [facility]  08/24/2023   Widely patent Schatzki ring/Abnormal esophageal motility/No repeat indicated/SMR   COLONOSCOPY  03/28/2003, 04/02/1999, 11/14/1996, 08/05/1992   PH Adenomatous Polyp   EGD  11/19/2012, 03/28/2003   Endoscopy for Hernia     Lump Removed Right Side of Neck (Benign) 2011     Skin Cancer Removed from  back       Health Maintenance  Topic Date Due   Diabetes Education  Never done   Monofilament Foot Exam  Never done   Annual Urine Albumin Creatinine Ratio  Never done   Shingrix (1  of 2) Never done   RSV Immunization Pregnant or 50+ (1 - 1-dose 75+ series) Never done   Pneumococcal Vaccine: 50+ (2 of 2 - PCV) 03/22/2020   COVID-19 Vaccine (3 - 2025-26 season) 03/11/2024   Influenza Vaccine (1) 03/11/2024   Hemoglobin A1C  08/09/2024   Diabetes Eye Assessment Exam  10/30/2024   Potassium Level  02/06/2025   Lipid Panel  02/06/2025   Serum Calcium  02/06/2025   Depression Screening  07/10/2025   Medicare Subsequent AWV [id number]  07/11/2025   Annual Physical/Well Child Check  07/11/2025   Adult Tetanus (Td And Tdap)  03/10/2027   Colorectal Cancer Screening  08/23/2028   Medicare Initial AWV H9561  Completed   Hib Vaccines  Aged Out   Hepatitis A Vaccines  Aged Out   Meningococcal B Vaccine  Aged Out   Meningococcal ACWY Vaccine  Aged Out   HPV Vaccines  Aged Out    Vitals:   07/10/24 0926  BP: 120/78  Pulse: 70  SpO2: 96%  Weight: 97.1 kg (214 lb)  Height: 175.3 cm (5' 9)   Body mass index is 31.6 kg/m. General. Alert oriented x3  Skin. No suspicious lesions or moles.   Eyes. Sclera and conjunctiva clear; pupils equal round and reactive to light and accommodation;  extraocular movements intact Ears. External normal; canals clear; tympanic membranes normal Nose. Mucosa healthy without drainage or ulceration Oropharynx. No suspicious lesions Neck. No swelling, masses, stiffness, pain, limited movement, carotid pulses normal bilaterally, thyroid  normal size, no masses palpated.  No bruits Lungs. Respirations unlabored; clear to auscultation bilaterally Back. No spinal deformity Cardiovascular. Heart regular rate and rhythm without murmurs, gallops, or rubs Abdomen. Soft; non tender; non distended; normoactive bowel sounds; no masses or organomegaly Lymph Nodes. No significant cervical, supraclavicular, axillary or inguinal lymphadenopathy noted Musculoskeletal. No deformities; no active joint inflammation Extremities. Normal, no edema Pulses. Dorsalis pedis palpable and symmetric bilaterally Neurologic. Alert and oriented; speech intact; face symmetrical; moves all extremities well  Goals      Lose Weight     Maintain health/healthy lifestyle        Assessment/Plan  1. Medicare wellness visit- Medications and allergies reviewed. Copy of preventative health provided.  Labs reviewed. 2. Labs today 3. F/u with Cardiology 4. Meds refilled 5. Refer to Surgery for hernia 6. RTC 6 mo, sooner if needed Reyes JONETTA Costa, MD, MD  *Some images could not be shown.

## 2024-07-10 NOTE — Telephone Encounter (Signed)
 Patients wife is returning a call back. Please call back to #[phone]

## 2024-07-17 NOTE — Progress Notes (Signed)
 ZYIER DYKEMA                                          MRN: [id number]   07/17/2024   The [facility] Quality Team Specialist reviewed this patient medical record for the purposes of chart review for care gap closure. The following were reviewed: chart review for care gap closure-kidney health evaluation for diabetes:eGFR  and uACR.    [facility] Quality Team
